# Patient Record
Sex: Female | Born: 1985 | Race: White | Hispanic: No | Marital: Married | Smoking: Never smoker
Health system: Southern US, Community
[De-identification: ages and names within clinical notes are randomized; demographics above are authoritative.]

## PROBLEM LIST (undated history)

## (undated) DIAGNOSIS — M51369 Other intervertebral disc degeneration, lumbar region without mention of lumbar back pain or lower extremity pain: Secondary | ICD-10-CM

## (undated) DIAGNOSIS — M5136 Other intervertebral disc degeneration, lumbar region: Secondary | ICD-10-CM

## (undated) DIAGNOSIS — Z8632 Personal history of gestational diabetes: Secondary | ICD-10-CM

## (undated) DIAGNOSIS — Z3A01 Less than 8 weeks gestation of pregnancy: Secondary | ICD-10-CM

## (undated) DIAGNOSIS — G4730 Sleep apnea, unspecified: Secondary | ICD-10-CM

## (undated) DIAGNOSIS — O24419 Gestational diabetes mellitus in pregnancy, unspecified control: Secondary | ICD-10-CM

## (undated) DIAGNOSIS — I10 Essential (primary) hypertension: Secondary | ICD-10-CM

## (undated) DIAGNOSIS — R011 Cardiac murmur, unspecified: Secondary | ICD-10-CM

## (undated) DIAGNOSIS — Z98891 History of uterine scar from previous surgery: Secondary | ICD-10-CM

## (undated) HISTORY — DX: Personal history of gestational diabetes: Z86.32

## (undated) HISTORY — DX: Cardiac murmur, unspecified: R01.1

## (undated) HISTORY — DX: Other intervertebral disc degeneration, lumbar region: M51.36

## (undated) HISTORY — PX: APPENDECTOMY: SHX54

## (undated) HISTORY — DX: Sleep apnea, unspecified: G47.30

## (undated) HISTORY — DX: Less than 8 weeks gestation of pregnancy: Z3A.01

## (undated) HISTORY — DX: Other intervertebral disc degeneration, lumbar region without mention of lumbar back pain or lower extremity pain: M51.369

## (undated) HISTORY — DX: History of uterine scar from previous surgery: Z98.891

---

## 2018-07-02 ENCOUNTER — Encounter (HOSPITAL_COMMUNITY): Payer: Self-pay

## 2018-07-02 LAB — OB RESULTS CONSOLE ABO/RH: RH Type: POSITIVE

## 2018-07-02 LAB — OB RESULTS CONSOLE RUBELLA ANTIBODY, IGM
Rubella: IMMUNE
Rubella: IMMUNE

## 2018-07-02 LAB — OB RESULTS CONSOLE HEPATITIS B SURFACE ANTIGEN: Hepatitis B Surface Ag: NEGATIVE

## 2018-07-02 LAB — OB RESULTS CONSOLE GC/CHLAMYDIA
Chlamydia: NEGATIVE
Gonorrhea: NEGATIVE

## 2018-07-02 LAB — OB RESULTS CONSOLE HIV ANTIBODY (ROUTINE TESTING): HIV: NONREACTIVE

## 2018-07-02 LAB — OB RESULTS CONSOLE ANTIBODY SCREEN: Antibody Screen: NEGATIVE

## 2018-07-02 LAB — OB RESULTS CONSOLE RPR: RPR: NONREACTIVE

## 2018-07-08 ENCOUNTER — Ambulatory Visit: Payer: Self-pay | Admitting: Cardiovascular Disease

## 2018-07-09 ENCOUNTER — Encounter: Payer: Self-pay | Admitting: Cardiovascular Disease

## 2018-07-21 ENCOUNTER — Encounter: Payer: Self-pay | Admitting: Cardiovascular Disease

## 2018-08-12 ENCOUNTER — Other Ambulatory Visit (HOSPITAL_COMMUNITY): Payer: Self-pay | Admitting: Obstetrics and Gynecology

## 2018-08-12 DIAGNOSIS — Z3689 Encounter for other specified antenatal screening: Secondary | ICD-10-CM

## 2018-08-12 DIAGNOSIS — Q909 Down syndrome, unspecified: Secondary | ICD-10-CM

## 2018-08-12 DIAGNOSIS — Z3A19 19 weeks gestation of pregnancy: Secondary | ICD-10-CM

## 2018-08-19 NOTE — Progress Notes (Signed)
Cardiology Office Note   Date:  08/20/2018   ID:  Amanda Lee, DOB Sep 15, 1985, MRN [id number]  PCP:  [facility]   Cardiologist:   Jenkins Rouge, MD   No chief complaint on file.     History of Present Illness: Amanda Lee is a 32 y.o. female who presents for consultation regarding murmur. Referred by Ob/Gyn Faulk ? Noted age 53. Not described currently Has not had w/u or f/u for such. No history of scarlet or rheumatic  Fever. History of gestational DM. There was also a note with ? OSA  She is remarried for 3 years Has son Ronaldo Miyamoto in middle school from first marriage and  4 months along with this pregnancy Works in Therapist, art for [employer]    Past Medical History:  Diagnosis Date  . Degeneration of lumbar intervertebral disc   . Heart murmur   . Hx of cesarean section   . Hx of gestational diabetes mellitus, not currently pregnant   . Less than [redacted] weeks gestation of pregnancy   . Sleep apnea 3       Current Outpatient Medications  Medication Sig Dispense Refill  . aspirin EC 81 MG tablet Take 81 mg by mouth daily.    . Doxylamine Succinate, Sleep, (UNISOM PO) Take by mouth daily.    Marland Kitchen labetalol (NORMODYNE) 100 MG tablet Take 100 mg by mouth 2 (two) times daily.    . Prenatal Multivit-Min-Fe-FA (PRENATAL VITAMINS PO) Take by mouth daily.    . Pyridoxine HCl (VITAMIN B6 PO) Take by mouth daily.     No current facility-administered medications for this visit.     Allergies:   Acetaminophen    Social History:  The patient  reports that she has never smoked. She has never used smokeless tobacco. She reports previous alcohol use. She reports that she does not use drugs.   Family History:  The patient's family history includes Diabetes (age of onset: 52) in her mother; Endocrine tumor (age of onset: 61) in her maternal grandmother; Heart disease in her maternal grandmother; Hypertension (age of onset: 44) in her maternal  grandmother and mother; Skin cancer in her maternal grandmother.    ROS:  Please see the history of present illness.   Otherwise, review of systems are positive for none.   All other systems are reviewed and negative.    PHYSICAL EXAM: VS:  BP 136/72   Pulse 91   Ht 5\' 3"  (1.6 m)   Wt 234 lb (106.1 kg)   BMI 41.45 kg/m  , BMI Body mass index is 41.45 kg/m. Affect appropriate Pregnant overweight white female  HEENT: normal Neck supple with no adenopathy JVP normal no bruits no thyromegaly Lungs clear with no wheezing and good diaphragmatic motion Heart:  S1/S2 no murmur, no rub, gallop or click PMI normal Abdomen: benighn, BS positve, no tenderness, no AAA post appendectomy  no bruit.  No HSM or HJR Distal pulses intact with no bruits No edema Neuro non-focal Skin warm and dry No muscular weakness    EKG:  NSR normal ECG    Recent Labs: No results found for requested labs within last 8760 hours.    Lipid Panel No results found for: CHOL, TRIG, HDL, CHOLHDL, VLDL, LDLCALC, LDLDIRECT    Wt Readings from Last 3 Encounters:  08/20/18 234 lb (106.1 kg)      Other studies Reviewed: Additional studies/ records that were reviewed today include: Notes from Ob/Gyn .  ASSESSMENT AND PLAN:  1.  Murmur:  Historical did not hear on exam today f/u TTE since she is pregnant 2.  OSA:  Best to wait until after delivery to do sleep study refer to Dr Radford Pax July 3.  DM:  Gestational f/u A1c with primary discussed low carb diet    Current medicines are reviewed at length with the patient today.  The patient does not have concerns regarding medicines.  The following changes have been made:  no change  Labs/ tests ordered today include: TTE   Orders Placed This Encounter  Procedures  . EKG 12-Lead  . ECHOCARDIOGRAM COMPLETE     Disposition:   FU with cardiology PRN      Signed, Jenkins Rouge, MD  08/20/2018 4:11 PM    [facility] [place], [place], [place]  [address] Phone: [phone]; Fax: [phone]

## 2018-08-20 ENCOUNTER — Ambulatory Visit (INDEPENDENT_AMBULATORY_CARE_PROVIDER_SITE_OTHER): Payer: 59 | Admitting: Cardiovascular Disease

## 2018-08-20 VITALS — BP 136/72 | HR 91 | Ht 63.0 in | Wt 234.0 lb

## 2018-08-20 DIAGNOSIS — R011 Cardiac murmur, unspecified: Secondary | ICD-10-CM | POA: Diagnosis not present

## 2018-08-20 NOTE — Patient Instructions (Addendum)
Medication Instructions:   If you need a refill on your cardiac medications before your next appointment, please call your pharmacy.   Lab work:  If you have labs (blood work) drawn today and your tests are completely normal, you will receive your results only by: Marland Kitchen MyChart Message (if you have MyChart) OR . A paper copy in the mail If you have any lab test that is abnormal or we need to change your treatment, we will call you to review the results.  Testing/Procedures: Your physician has requested that you have an echocardiogram. Echocardiography is a painless test that uses sound waves to create images of your heart. It provides your doctor with information about the size and shape of your heart and how well your heart's chambers and valves are working. This procedure takes approximately one hour. There are no restrictions for this procedure.  Follow-Up: At [facility], you and your health needs are our priority.  As part of our continuing mission to provide you with exceptional heart care, we have created designated Provider Care Teams.  These Care Teams include your primary Cardiologist (physician) and Advanced Practice Providers (APPs -  Physician Assistants and Nurse Practitioners) who all work together to provide you with the care you need, when you need it. Your physician recommends that you schedule a follow-up appointment as needed with Dr. Johnsie Cancel.  Your physician recommends that you schedule a follow-up appointment in: July with Dr. Radford Pax for new patient consult for sleep apnea.

## 2018-09-17 ENCOUNTER — Other Ambulatory Visit: Payer: Self-pay

## 2018-09-17 ENCOUNTER — Encounter (HOSPITAL_COMMUNITY): Payer: Self-pay

## 2018-09-17 ENCOUNTER — Ambulatory Visit (HOSPITAL_COMMUNITY): Payer: BLUE CROSS/BLUE SHIELD | Attending: Cardiovascular Disease

## 2018-09-17 DIAGNOSIS — R011 Cardiac murmur, unspecified: Secondary | ICD-10-CM | POA: Diagnosis present

## 2018-09-22 ENCOUNTER — Encounter (HOSPITAL_COMMUNITY): Payer: Self-pay | Admitting: *Deleted

## 2018-09-24 ENCOUNTER — Ambulatory Visit (HOSPITAL_COMMUNITY)
Admission: RE | Admit: 2018-09-24 | Discharge: 2018-09-24 | Disposition: A | Discharge status: Home / Self Care | Payer: BLUE CROSS/BLUE SHIELD | Source: Ambulatory Visit | Attending: Obstetrics and Gynecology | Admitting: Obstetrics and Gynecology

## 2018-09-24 ENCOUNTER — Other Ambulatory Visit: Payer: Self-pay

## 2018-09-24 ENCOUNTER — Encounter (HOSPITAL_COMMUNITY): Payer: Self-pay

## 2018-09-24 ENCOUNTER — Ambulatory Visit (HOSPITAL_COMMUNITY): Payer: Self-pay | Admitting: Obstetrics and Gynecology

## 2018-09-24 DIAGNOSIS — O289 Unspecified abnormal findings on antenatal screening of mother: Secondary | ICD-10-CM

## 2018-09-24 DIAGNOSIS — O99212 Obesity complicating pregnancy, second trimester: Secondary | ICD-10-CM | POA: Diagnosis not present

## 2018-09-24 DIAGNOSIS — Q909 Down syndrome, unspecified: Secondary | ICD-10-CM | POA: Diagnosis present

## 2018-09-24 DIAGNOSIS — O34219 Maternal care for unspecified type scar from previous cesarean delivery: Secondary | ICD-10-CM

## 2018-09-24 DIAGNOSIS — O09292 Supervision of pregnancy with other poor reproductive or obstetric history, second trimester: Secondary | ICD-10-CM | POA: Diagnosis not present

## 2018-09-24 DIAGNOSIS — O132 Gestational [pregnancy-induced] hypertension without significant proteinuria, second trimester: Secondary | ICD-10-CM | POA: Diagnosis not present

## 2018-09-24 DIAGNOSIS — Z3689 Encounter for other specified antenatal screening: Secondary | ICD-10-CM | POA: Insufficient documentation

## 2018-09-24 DIAGNOSIS — Z3A19 19 weeks gestation of pregnancy: Secondary | ICD-10-CM | POA: Diagnosis present

## 2018-09-24 HISTORY — DX: Other intervertebral disc degeneration, lumbar region without mention of lumbar back pain or lower extremity pain: M51.369

## 2018-09-24 HISTORY — DX: Other intervertebral disc degeneration, lumbar region: M51.36

## 2018-09-24 HISTORY — DX: Essential (primary) hypertension: I10

## 2018-10-15 ENCOUNTER — Other Ambulatory Visit (HOSPITAL_COMMUNITY): Payer: Self-pay | Admitting: Obstetrics and Gynecology

## 2018-10-19 ENCOUNTER — Other Ambulatory Visit (HOSPITAL_COMMUNITY): Payer: Self-pay | Admitting: Obstetrics and Gynecology

## 2018-10-19 DIAGNOSIS — O09292 Supervision of pregnancy with other poor reproductive or obstetric history, second trimester: Secondary | ICD-10-CM

## 2018-11-03 ENCOUNTER — Encounter: Payer: Self-pay | Admitting: Registered"

## 2018-11-03 ENCOUNTER — Encounter: Payer: BLUE CROSS/BLUE SHIELD | Attending: Obstetrics and Gynecology | Admitting: Registered"

## 2018-11-03 DIAGNOSIS — O99810 Abnormal glucose complicating pregnancy: Secondary | ICD-10-CM | POA: Diagnosis present

## 2018-11-03 NOTE — Progress Notes (Signed)
Patient was seen on 11/03/18 for Gestational Diabetes self-management class at the [facility]. The following learning objectives were met by the patient during this course:   States the definition of Gestational Diabetes  States why dietary management is important in controlling blood glucose  Describes the effects each nutrient has on blood glucose levels  Demonstrates ability to create a balanced meal plan  Demonstrates carbohydrate counting   States when to check blood glucose levels  Demonstrates proper blood glucose monitoring techniques  States the effect of stress and exercise on blood glucose levels  States the importance of limiting caffeine and abstaining from alcohol and smoking  Blood glucose monitor given: Accu-chek guide me Lot # [id number] Exp: 10/26/19 Blood glucose reading: 104 mg/dL  Patient instructed to monitor glucose levels: FBS: 60 - <95; 1 hour: <140; 2 hour: <120  Patient received handouts:  Nutrition Diabetes and Pregnancy, including carb counting list  Patient will be seen for follow-up as needed.

## 2018-11-12 ENCOUNTER — Ambulatory Visit (HOSPITAL_COMMUNITY)
Admission: RE | Admit: 2018-11-12 | Discharge: 2018-11-12 | Disposition: A | Discharge status: Home / Self Care | Payer: BLUE CROSS/BLUE SHIELD | Source: Ambulatory Visit | Attending: Obstetrics and Gynecology | Admitting: Obstetrics and Gynecology

## 2018-11-12 ENCOUNTER — Other Ambulatory Visit: Payer: Self-pay

## 2018-11-12 DIAGNOSIS — Z3A26 26 weeks gestation of pregnancy: Secondary | ICD-10-CM

## 2018-11-12 DIAGNOSIS — Z362 Encounter for other antenatal screening follow-up: Secondary | ICD-10-CM | POA: Diagnosis not present

## 2018-11-12 DIAGNOSIS — O34219 Maternal care for unspecified type scar from previous cesarean delivery: Secondary | ICD-10-CM | POA: Diagnosis not present

## 2018-11-12 DIAGNOSIS — O09292 Supervision of pregnancy with other poor reproductive or obstetric history, second trimester: Secondary | ICD-10-CM

## 2018-11-12 DIAGNOSIS — O99213 Obesity complicating pregnancy, third trimester: Secondary | ICD-10-CM

## 2018-11-12 DIAGNOSIS — O132 Gestational [pregnancy-induced] hypertension without significant proteinuria, second trimester: Secondary | ICD-10-CM

## 2018-11-12 DIAGNOSIS — O3412 Maternal care for benign tumor of corpus uteri, second trimester: Secondary | ICD-10-CM

## 2018-11-12 DIAGNOSIS — O289 Unspecified abnormal findings on antenatal screening of mother: Secondary | ICD-10-CM

## 2018-11-15 ENCOUNTER — Other Ambulatory Visit (HOSPITAL_COMMUNITY): Payer: Self-pay | Admitting: *Deleted

## 2018-11-15 DIAGNOSIS — Z362 Encounter for other antenatal screening follow-up: Secondary | ICD-10-CM

## 2018-12-10 ENCOUNTER — Ambulatory Visit (HOSPITAL_COMMUNITY): Payer: BLUE CROSS/BLUE SHIELD

## 2019-01-07 ENCOUNTER — Ambulatory Visit (HOSPITAL_COMMUNITY)
Admission: RE | Admit: 2019-01-07 | Discharge: 2019-01-07 | Disposition: A | Discharge status: Home / Self Care | Payer: BLUE CROSS/BLUE SHIELD | Source: Ambulatory Visit | Attending: Maternal & Fetal Medicine | Admitting: Maternal & Fetal Medicine

## 2019-01-07 ENCOUNTER — Other Ambulatory Visit (HOSPITAL_COMMUNITY): Payer: Self-pay | Admitting: Maternal & Fetal Medicine

## 2019-01-07 ENCOUNTER — Other Ambulatory Visit: Payer: Self-pay

## 2019-01-07 DIAGNOSIS — O24419 Gestational diabetes mellitus in pregnancy, unspecified control: Secondary | ICD-10-CM | POA: Diagnosis not present

## 2019-01-07 DIAGNOSIS — O289 Unspecified abnormal findings on antenatal screening of mother: Secondary | ICD-10-CM

## 2019-01-07 DIAGNOSIS — O09299 Supervision of pregnancy with other poor reproductive or obstetric history, unspecified trimester: Secondary | ICD-10-CM

## 2019-01-07 DIAGNOSIS — O3410 Maternal care for benign tumor of corpus uteri, unspecified trimester: Secondary | ICD-10-CM

## 2019-01-07 DIAGNOSIS — O99213 Obesity complicating pregnancy, third trimester: Secondary | ICD-10-CM | POA: Diagnosis not present

## 2019-01-07 DIAGNOSIS — O34219 Maternal care for unspecified type scar from previous cesarean delivery: Secondary | ICD-10-CM

## 2019-01-07 DIAGNOSIS — Z3A34 34 weeks gestation of pregnancy: Secondary | ICD-10-CM

## 2019-01-07 DIAGNOSIS — Z362 Encounter for other antenatal screening follow-up: Secondary | ICD-10-CM | POA: Insufficient documentation

## 2019-01-07 DIAGNOSIS — O139 Gestational [pregnancy-induced] hypertension without significant proteinuria, unspecified trimester: Secondary | ICD-10-CM

## 2019-01-11 ENCOUNTER — Other Ambulatory Visit (HOSPITAL_COMMUNITY): Payer: Self-pay | Admitting: *Deleted

## 2019-01-11 DIAGNOSIS — O24415 Gestational diabetes mellitus in pregnancy, controlled by oral hypoglycemic drugs: Secondary | ICD-10-CM

## 2019-01-18 ENCOUNTER — Encounter (HOSPITAL_COMMUNITY): Payer: Self-pay | Admitting: *Deleted

## 2019-01-21 ENCOUNTER — Encounter (HOSPITAL_COMMUNITY): Payer: Self-pay | Admitting: *Deleted

## 2019-01-21 NOTE — Patient Instructions (Signed)
Amanda Lee  01/21/2019   Your procedure is scheduled on:  02/02/2019  Arrive at 59 at Entrance C on [address] at [facility]  and [address] [employer] [address]. You are invited to use the FREE valet parking or use the Visitor's parking deck.  Pick up the phone at the desk and dial [phone].  Call this number if you have problems the morning of surgery: [phone]  Remember:   Do not eat food:(After Midnight) Desps de medianoche.  Do not drink clear liquids: (After Midnight) Desps de medianoche.  Take these medicines the morning of surgery with A SIP OF WATER:  Take labetalol as prescribed.  DO NOT TAKE METFORMIN THE NIGHT BEFORE OR THE MORNING OF SURGERY   Do not wear jewelry, make-up or nail polish.  Do not wear lotions, powders, or perfumes. Do not wear deodorant.  Do not shave 48 hours prior to surgery.  Do not bring valuables to the hospital.  [facility] is not   responsible for any belongings or valuables brought to the hospital.  Contacts, dentures or bridgework may not be worn into surgery.  Leave suitcase in the car. After surgery it may be brought to your room.  For patients admitted to the hospital, checkout time is 11:00 AM the day of              discharge.      Please read over the following fact sheets that you were given:     Preparing for Surgery

## 2019-01-28 ENCOUNTER — Other Ambulatory Visit: Payer: Self-pay

## 2019-01-28 ENCOUNTER — Encounter (HOSPITAL_COMMUNITY): Payer: Self-pay

## 2019-01-28 ENCOUNTER — Inpatient Hospital Stay (HOSPITAL_COMMUNITY)
Admission: AD | Admit: 2019-01-28 | Discharge: 2019-01-28 | Disposition: A | Discharge status: Home / Self Care | Payer: BLUE CROSS/BLUE SHIELD | Attending: Obstetrics and Gynecology | Admitting: Obstetrics and Gynecology

## 2019-01-28 DIAGNOSIS — O471 False labor at or after 37 completed weeks of gestation: Secondary | ICD-10-CM | POA: Insufficient documentation

## 2019-01-28 DIAGNOSIS — O479 False labor, unspecified: Secondary | ICD-10-CM

## 2019-01-28 DIAGNOSIS — Z0371 Encounter for suspected problem with amniotic cavity and membrane ruled out: Secondary | ICD-10-CM | POA: Diagnosis not present

## 2019-01-28 DIAGNOSIS — Z3A38 38 weeks gestation of pregnancy: Secondary | ICD-10-CM | POA: Insufficient documentation

## 2019-01-28 LAB — POCT FERN TEST: POCT Fern Test: NEGATIVE

## 2019-01-28 LAB — AMNISURE RUPTURE OF MEMBRANE (ROM) NOT AT ARMC: Amnisure ROM: NEGATIVE

## 2019-01-28 NOTE — MAU Note (Signed)
Ctxs off and on all day. Closer since 1845. Has been leaking fld/dc for past wk. For repeat c/s

## 2019-01-28 NOTE — Discharge Instructions (Signed)
Call your OB Clinic or go to [facility] if:  You begin to have strong, frequent contractions  Your water breaks.  Sometimes it is a big gush of fluid, sometimes it is just a trickle that keeps getting your panties wet or running down your legs  You have vaginal bleeding.  It is normal to have a small amount of spotting if your cervix was checked.   You don't feel your baby moving like normal.  If you don't, get you something to eat and drink and lay down and focus on feeling your baby move.  You should feel at least 10 movements in 2 hours.  If you don't, you should call the office or go to [facility].

## 2019-01-31 ENCOUNTER — Other Ambulatory Visit: Payer: Self-pay

## 2019-01-31 ENCOUNTER — Other Ambulatory Visit (HOSPITAL_COMMUNITY)
Admission: RE | Admit: 2019-01-31 | Discharge: 2019-01-31 | Disposition: A | Discharge status: Home / Self Care | Payer: BLUE CROSS/BLUE SHIELD | Source: Ambulatory Visit | Attending: Obstetrics and Gynecology | Admitting: Obstetrics and Gynecology

## 2019-01-31 DIAGNOSIS — Z1159 Encounter for screening for other viral diseases: Secondary | ICD-10-CM | POA: Insufficient documentation

## 2019-01-31 NOTE — MAU Note (Signed)
Asymptomatic. Swab collected without problem.

## 2019-02-01 LAB — NOVEL CORONAVIRUS, NAA (HOSP ORDER, SEND-OUT TO REF LAB; TAT 18-24 HRS): SARS-CoV-2, NAA: NOT DETECTED
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
## 2019-02-02 ENCOUNTER — Inpatient Hospital Stay (HOSPITAL_COMMUNITY): Payer: BLUE CROSS/BLUE SHIELD | Admitting: Anesthesiology

## 2019-02-02 ENCOUNTER — Encounter (HOSPITAL_COMMUNITY)
Admission: RE | Disposition: A | Discharge status: Home / Self Care | Payer: Self-pay | Source: Home / Self Care | Attending: Obstetrics and Gynecology

## 2019-02-02 ENCOUNTER — Encounter (HOSPITAL_COMMUNITY): Payer: Self-pay

## 2019-02-02 ENCOUNTER — Inpatient Hospital Stay (HOSPITAL_COMMUNITY)
Admission: RE | Admit: 2019-02-02 | Discharge: 2019-02-04 | DRG: 787 | Disposition: A | Discharge status: Home / Self Care | Payer: BLUE CROSS/BLUE SHIELD | Attending: Obstetrics and Gynecology | Admitting: Obstetrics and Gynecology

## 2019-02-02 ENCOUNTER — Other Ambulatory Visit: Payer: Self-pay

## 2019-02-02 DIAGNOSIS — Z1159 Encounter for screening for other viral diseases: Secondary | ICD-10-CM | POA: Diagnosis not present

## 2019-02-02 DIAGNOSIS — O1002 Pre-existing essential hypertension complicating childbirth: Secondary | ICD-10-CM | POA: Diagnosis present

## 2019-02-02 DIAGNOSIS — O34211 Maternal care for low transverse scar from previous cesarean delivery: Principal | ICD-10-CM | POA: Diagnosis present

## 2019-02-02 DIAGNOSIS — O3413 Maternal care for benign tumor of corpus uteri, third trimester: Secondary | ICD-10-CM | POA: Diagnosis present

## 2019-02-02 DIAGNOSIS — O99824 Streptococcus B carrier state complicating childbirth: Secondary | ICD-10-CM | POA: Diagnosis present

## 2019-02-02 DIAGNOSIS — Z3A38 38 weeks gestation of pregnancy: Secondary | ICD-10-CM | POA: Diagnosis not present

## 2019-02-02 DIAGNOSIS — D252 Subserosal leiomyoma of uterus: Secondary | ICD-10-CM | POA: Diagnosis present

## 2019-02-02 DIAGNOSIS — Z3490 Encounter for supervision of normal pregnancy, unspecified, unspecified trimester: Secondary | ICD-10-CM

## 2019-02-02 DIAGNOSIS — O24429 Gestational diabetes mellitus in childbirth, unspecified control: Secondary | ICD-10-CM | POA: Diagnosis present

## 2019-02-02 DIAGNOSIS — Z98891 History of uterine scar from previous surgery: Secondary | ICD-10-CM

## 2019-02-02 DIAGNOSIS — O3663X0 Maternal care for excessive fetal growth, third trimester, not applicable or unspecified: Secondary | ICD-10-CM | POA: Diagnosis present

## 2019-02-02 DIAGNOSIS — O321XX0 Maternal care for breech presentation, not applicable or unspecified: Secondary | ICD-10-CM | POA: Diagnosis present

## 2019-02-02 DIAGNOSIS — O99214 Obesity complicating childbirth: Secondary | ICD-10-CM | POA: Diagnosis present

## 2019-02-02 HISTORY — DX: Gestational diabetes mellitus in pregnancy, unspecified control: O24.419

## 2019-02-02 LAB — TYPE AND SCREEN
ABO/RH(D): O POS
Antibody Screen: NEGATIVE

## 2019-02-02 LAB — CBC
HCT: 35 % — ABNORMAL LOW (ref 36.0–46.0)
Hemoglobin: 11.1 g/dL — ABNORMAL LOW (ref 12.0–15.0)
MCH: 26.3 pg (ref 26.0–34.0)
MCHC: 31.7 g/dL (ref 30.0–36.0)
MCV: 82.9 fL (ref 80.0–100.0)
Platelets: 157 10*3/uL (ref 150–400)
RBC: 4.22 MIL/uL (ref 3.87–5.11)
RDW: 15.1 % (ref 11.5–15.5)
WBC: 7.2 10*3/uL (ref 4.0–10.5)
nRBC: 0 % (ref 0.0–0.2)

## 2019-02-02 LAB — COMPREHENSIVE METABOLIC PANEL
ALT: 20 U/L (ref 0–44)
AST: 19 U/L (ref 15–41)
Albumin: 2.5 g/dL — ABNORMAL LOW (ref 3.5–5.0)
Alkaline Phosphatase: 121 U/L (ref 38–126)
Anion gap: 11 (ref 5–15)
BUN: 8 mg/dL (ref 6–20)
CO2: 22 mmol/L (ref 22–32)
Calcium: 9.1 mg/dL (ref 8.9–10.3)
Chloride: 105 mmol/L (ref 98–111)
Creatinine, Ser: 0.53 mg/dL (ref 0.44–1.00)
GFR calc Af Amer: >60 mL/min (ref >60)
GFR calc non Af Amer: >60 mL/min (ref >60)
Glucose, Bld: 128 mg/dL — ABNORMAL HIGH (ref 70–99)
Potassium: 4 mmol/L (ref 3.5–5.1)
Sodium: 138 mmol/L (ref 135–145)
Total Bilirubin: 0.3 mg/dL (ref 0.3–1.2)
Total Protein: 5.3 g/dL — ABNORMAL LOW (ref 6.5–8.1)

## 2019-02-02 LAB — GLUCOSE, CAPILLARY: Glucose-Capillary: 128 mg/dL — ABNORMAL HIGH (ref 70–99)

## 2019-02-02 SURGERY — Surgical Case
Anesthesia: Spinal

## 2019-02-02 MED ORDER — OXYCODONE HCL 5 MG/5ML PO SOLN: 5.0000 mg | Freq: Once | ORAL | Status: DC | PRN

## 2019-02-02 MED ORDER — SOD CITRATE-CITRIC ACID 500-334 MG/5ML PO SOLN
30.0000 mL | ORAL | Status: AC
  Administered 2019-02-02: 30 mL via ORAL

## 2019-02-02 MED ORDER — METOCLOPRAMIDE HCL 5 MG/ML IJ SOLN
INTRAMUSCULAR | Status: DC | PRN
  Administered 2019-02-02: 10 mg via INTRAVENOUS

## 2019-02-02 MED ORDER — NALOXONE HCL 0.4 MG/ML IJ SOLN: 0.4000 mg | INTRAMUSCULAR | Status: DC | PRN

## 2019-02-02 MED ORDER — ACETAMINOPHEN 160 MG/5ML PO SOLN: 325.0000 mg | ORAL | Status: DC | PRN

## 2019-02-02 MED ORDER — MEPERIDINE HCL 25 MG/ML IJ SOLN: 6.2500 mg | INTRAMUSCULAR | Status: DC | PRN

## 2019-02-02 MED ORDER — ZOLPIDEM TARTRATE 5 MG PO TABS: 5.0000 mg | ORAL_TABLET | Freq: Every evening | ORAL | Status: DC | PRN

## 2019-02-02 MED ORDER — SODIUM CHLORIDE 0.9% FLUSH: 3.0000 mL | INTRAVENOUS | Status: DC | PRN

## 2019-02-02 MED ORDER — LABETALOL HCL 200 MG PO TABS: 200.0000 mg | ORAL_TABLET | Freq: Every day | ORAL | Status: DC

## 2019-02-02 MED ORDER — MORPHINE SULFATE (PF) 0.5 MG/ML IJ SOLN
INTRAMUSCULAR | Status: AC
  Filled 2019-02-02: qty 10

## 2019-02-02 MED ORDER — MORPHINE SULFATE (PF) 0.5 MG/ML IJ SOLN
INTRAMUSCULAR | Status: DC | PRN
  Administered 2019-02-02: .15 mg via INTRATHECAL

## 2019-02-02 MED ORDER — DEXTROSE 5 % IV SOLN
INTRAVENOUS | Status: AC
  Filled 2019-02-02: qty 3000

## 2019-02-02 MED ORDER — NALBUPHINE HCL 10 MG/ML IJ SOLN: 5.0000 mg | INTRAMUSCULAR | Status: DC | PRN

## 2019-02-02 MED ORDER — FENTANYL CITRATE (PF) 100 MCG/2ML IJ SOLN
INTRAMUSCULAR | Status: AC
  Filled 2019-02-02: qty 2

## 2019-02-02 MED ORDER — DEXAMETHASONE SODIUM PHOSPHATE 4 MG/ML IJ SOLN
INTRAMUSCULAR | Status: AC
  Filled 2019-02-02: qty 1

## 2019-02-02 MED ORDER — COCONUT OIL OIL
1.0000 "application " | TOPICAL_OIL | Status: DC | PRN
  Administered 2019-02-03: 1 via TOPICAL

## 2019-02-02 MED ORDER — KETOROLAC TROMETHAMINE 30 MG/ML IJ SOLN
INTRAMUSCULAR | Status: AC
  Filled 2019-02-02: qty 1

## 2019-02-02 MED ORDER — ACETAMINOPHEN 325 MG PO TABS: 325.0000 mg | ORAL_TABLET | ORAL | Status: DC | PRN

## 2019-02-02 MED ORDER — DIPHENHYDRAMINE HCL 50 MG/ML IJ SOLN: 12.5000 mg | INTRAMUSCULAR | Status: DC | PRN

## 2019-02-02 MED ORDER — OXYTOCIN 40 UNITS IN NORMAL SALINE INFUSION - SIMPLE MED
INTRAVENOUS | Status: DC | PRN
  Administered 2019-02-02 (×5): 100 mL via INTRAVENOUS

## 2019-02-02 MED ORDER — SCOPOLAMINE 1 MG/3DAYS TD PT72
MEDICATED_PATCH | TRANSDERMAL | Status: AC
  Filled 2019-02-02: qty 1

## 2019-02-02 MED ORDER — SIMETHICONE 80 MG PO CHEW
80.0000 mg | CHEWABLE_TABLET | Freq: Three times a day (TID) | ORAL | Status: DC
  Administered 2019-02-03 – 2019-02-04 (×3): 80 mg via ORAL
  Filled 2019-02-02 (×4): qty 1

## 2019-02-02 MED ORDER — DIPHENHYDRAMINE HCL 25 MG PO CAPS: 25.0000 mg | ORAL_CAPSULE | ORAL | Status: DC | PRN

## 2019-02-02 MED ORDER — SCOPOLAMINE 1 MG/3DAYS TD PT72
1.0000 | MEDICATED_PATCH | Freq: Once | TRANSDERMAL | Status: DC
  Administered 2019-02-02: 1.5 mg via TRANSDERMAL

## 2019-02-02 MED ORDER — DEXTROSE 5 % IV SOLN
3.0000 g | INTRAVENOUS | Status: AC
  Administered 2019-02-02: 3 g via INTRAVENOUS
  Filled 2019-02-02: qty 3000

## 2019-02-02 MED ORDER — SENNOSIDES-DOCUSATE SODIUM 8.6-50 MG PO TABS
2.0000 | ORAL_TABLET | ORAL | Status: DC
  Administered 2019-02-02 – 2019-02-03 (×2): 2 via ORAL
  Filled 2019-02-02 (×2): qty 2

## 2019-02-02 MED ORDER — FENTANYL CITRATE (PF) 100 MCG/2ML IJ SOLN
INTRAMUSCULAR | Status: DC | PRN
  Administered 2019-02-02: 15 ug via INTRATHECAL

## 2019-02-02 MED ORDER — OXYCODONE HCL 5 MG PO TABS
5.0000 mg | ORAL_TABLET | ORAL | Status: DC | PRN
  Administered 2019-02-03: 5 mg via ORAL
  Filled 2019-02-02: qty 1

## 2019-02-02 MED ORDER — KETOROLAC TROMETHAMINE 30 MG/ML IJ SOLN
30.0000 mg | Freq: Four times a day (QID) | INTRAMUSCULAR | Status: AC | PRN
  Administered 2019-02-02: 13:00:00 30 mg via INTRAVENOUS

## 2019-02-02 MED ORDER — OXYTOCIN 40 UNITS IN NORMAL SALINE INFUSION - SIMPLE MED
INTRAVENOUS | Status: AC
  Filled 2019-02-02: qty 1000

## 2019-02-02 MED ORDER — PHENYLEPHRINE 40 MCG/ML (10ML) SYRINGE FOR IV PUSH (FOR BLOOD PRESSURE SUPPORT)
PREFILLED_SYRINGE | INTRAVENOUS | Status: AC
  Filled 2019-02-02: qty 30

## 2019-02-02 MED ORDER — METOCLOPRAMIDE HCL 5 MG/ML IJ SOLN
INTRAMUSCULAR | Status: AC
  Filled 2019-02-02: qty 2

## 2019-02-02 MED ORDER — PHENYLEPHRINE HCL-NACL 20-0.9 MG/250ML-% IV SOLN
INTRAVENOUS | Status: AC
  Filled 2019-02-02: qty 250

## 2019-02-02 MED ORDER — FENTANYL CITRATE (PF) 100 MCG/2ML IJ SOLN: 25.0000 ug | INTRAMUSCULAR | Status: DC | PRN

## 2019-02-02 MED ORDER — DIBUCAINE (PERIANAL) 1 % EX OINT: 1.0000 "application " | TOPICAL_OINTMENT | CUTANEOUS | Status: DC | PRN

## 2019-02-02 MED ORDER — LACTATED RINGERS IV SOLN
INTRAVENOUS | Status: DC
  Administered 2019-02-02 (×2): via INTRAVENOUS

## 2019-02-02 MED ORDER — KETOROLAC TROMETHAMINE 30 MG/ML IJ SOLN: 30.0000 mg | Freq: Four times a day (QID) | INTRAMUSCULAR | Status: AC | PRN

## 2019-02-02 MED ORDER — OXYCODONE HCL 5 MG PO TABS: 5.0000 mg | ORAL_TABLET | Freq: Once | ORAL | Status: DC | PRN

## 2019-02-02 MED ORDER — PHENYLEPHRINE HCL (PRESSORS) 10 MG/ML IV SOLN
INTRAVENOUS | Status: DC | PRN
  Administered 2019-02-02 (×11): 100 ug via INTRAVENOUS

## 2019-02-02 MED ORDER — BUPIVACAINE IN DEXTROSE 0.75-8.25 % IT SOLN
INTRATHECAL | Status: DC | PRN
  Administered 2019-02-02: 1.5 mL via INTRATHECAL

## 2019-02-02 MED ORDER — ONDANSETRON HCL 4 MG/2ML IJ SOLN
INTRAMUSCULAR | Status: AC
  Filled 2019-02-02: qty 2

## 2019-02-02 MED ORDER — MENTHOL 3 MG MT LOZG: 1.0000 | LOZENGE | OROMUCOSAL | Status: DC | PRN

## 2019-02-02 MED ORDER — KETOROLAC TROMETHAMINE 30 MG/ML IJ SOLN: 30.0000 mg | Freq: Once | INTRAMUSCULAR | Status: DC

## 2019-02-02 MED ORDER — NALOXONE HCL 4 MG/10ML IJ SOLN
1.0000 ug/kg/h | INTRAVENOUS | Status: DC | PRN
  Filled 2019-02-02: qty 5

## 2019-02-02 MED ORDER — OXYTOCIN 40 UNITS IN NORMAL SALINE INFUSION - SIMPLE MED: 2.5000 [IU]/h | INTRAVENOUS | Status: AC

## 2019-02-02 MED ORDER — PHENYLEPHRINE HCL-NACL 20-0.9 MG/250ML-% IV SOLN
INTRAVENOUS | Status: DC | PRN
  Administered 2019-02-02: 60 ug/min via INTRAVENOUS

## 2019-02-02 MED ORDER — DIPHENHYDRAMINE HCL 25 MG PO CAPS: 25.0000 mg | ORAL_CAPSULE | Freq: Four times a day (QID) | ORAL | Status: DC | PRN

## 2019-02-02 MED ORDER — IBUPROFEN 800 MG PO TABS
800.0000 mg | ORAL_TABLET | Freq: Three times a day (TID) | ORAL | Status: DC
  Administered 2019-02-02 – 2019-02-04 (×5): 800 mg via ORAL
  Filled 2019-02-02 (×5): qty 1

## 2019-02-02 MED ORDER — ONDANSETRON HCL 4 MG/2ML IJ SOLN: 4.0000 mg | Freq: Three times a day (TID) | INTRAMUSCULAR | Status: DC | PRN

## 2019-02-02 MED ORDER — TETANUS-DIPHTH-ACELL PERTUSSIS 5-2.5-18.5 LF-MCG/0.5 IM SUSP: 0.5000 mL | Freq: Once | INTRAMUSCULAR | Status: DC

## 2019-02-02 MED ORDER — LACTATED RINGERS IV SOLN
INTRAVENOUS | Status: DC
  Administered 2019-02-02: 18:00:00 via INTRAVENOUS

## 2019-02-02 MED ORDER — SIMETHICONE 80 MG PO CHEW: 80.0000 mg | CHEWABLE_TABLET | ORAL | Status: DC | PRN

## 2019-02-02 MED ORDER — ONDANSETRON HCL 4 MG/2ML IJ SOLN: 4.0000 mg | Freq: Once | INTRAMUSCULAR | Status: DC | PRN

## 2019-02-02 MED ORDER — SOD CITRATE-CITRIC ACID 500-334 MG/5ML PO SOLN
ORAL | Status: AC
  Filled 2019-02-02: qty 15

## 2019-02-02 MED ORDER — DEXAMETHASONE SODIUM PHOSPHATE 4 MG/ML IJ SOLN
INTRAMUSCULAR | Status: DC | PRN
  Administered 2019-02-02: 4 mg via INTRAVENOUS

## 2019-02-02 MED ORDER — NALBUPHINE HCL 10 MG/ML IJ SOLN: 5.0000 mg | Freq: Once | INTRAMUSCULAR | Status: DC | PRN

## 2019-02-02 MED ORDER — PRENATAL MULTIVITAMIN CH
1.0000 | ORAL_TABLET | Freq: Every day | ORAL | Status: DC
  Filled 2019-02-02: qty 1

## 2019-02-02 MED ORDER — SIMETHICONE 80 MG PO CHEW
80.0000 mg | CHEWABLE_TABLET | ORAL | Status: DC
  Administered 2019-02-02 – 2019-02-03 (×2): 80 mg via ORAL
  Filled 2019-02-02 (×2): qty 1

## 2019-02-02 MED ORDER — WITCH HAZEL-GLYCERIN EX PADS: 1.0000 "application " | MEDICATED_PAD | CUTANEOUS | Status: DC | PRN

## 2019-02-02 MED ORDER — ONDANSETRON HCL 4 MG/2ML IJ SOLN
INTRAMUSCULAR | Status: DC | PRN
  Administered 2019-02-02: 4 mg via INTRAVENOUS

## 2019-02-02 SURGICAL SUPPLY — 39 items
BENZOIN TINCTURE PRP APPL 2/3 (GAUZE/BANDAGES/DRESSINGS) ×3 IMPLANT
CHLORAPREP W/TINT 26ML (MISCELLANEOUS) ×3 IMPLANT
CLAMP CORD UMBIL (MISCELLANEOUS) IMPLANT
CLOSURE STERI-STRIP 1/2X4 (GAUZE/BANDAGES/DRESSINGS) ×1
CLOSURE WOUND 1/2 X4 (GAUZE/BANDAGES/DRESSINGS)
CLOTH BEACON ORANGE TIMEOUT ST (SAFETY) ×3 IMPLANT
CLSR STERI-STRIP ANTIMIC 1/2X4 (GAUZE/BANDAGES/DRESSINGS) ×2 IMPLANT
DRAPE C SECTION CLR SCREEN (DRAPES) ×3 IMPLANT
DRSG OPSITE POSTOP 4X10 (GAUZE/BANDAGES/DRESSINGS) ×3 IMPLANT
ELECT REM PT RETURN 9FT ADLT (ELECTROSURGICAL) ×3
ELECTRODE REM PT RTRN 9FT ADLT (ELECTROSURGICAL) ×1 IMPLANT
EXTRACTOR VACUUM KIWI (MISCELLANEOUS) IMPLANT
GLOVE BIO SURGEON STRL SZ 6.5 (GLOVE) ×2 IMPLANT
GLOVE BIO SURGEONS STRL SZ 6.5 (GLOVE) ×1
GLOVE BIOGEL PI IND STRL 7.0 (GLOVE) ×2 IMPLANT
GLOVE BIOGEL PI INDICATOR 7.0 (GLOVE) ×4
GOWN STRL REUS W/TWL LRG LVL3 (GOWN DISPOSABLE) ×6 IMPLANT
KIT ABG SYR 3ML LUER SLIP (SYRINGE) IMPLANT
NEEDLE HYPO 25X5/8 SAFETYGLIDE (NEEDLE) IMPLANT
NS IRRIG 1000ML POUR BTL (IV SOLUTION) ×3 IMPLANT
PACK C SECTION WH (CUSTOM PROCEDURE TRAY) ×3 IMPLANT
PAD OB MATERNITY 4.3X12.25 (PERSONAL CARE ITEMS) ×3 IMPLANT
RETRACTOR WND ALEXIS 25 LRG (MISCELLANEOUS) ×1 IMPLANT
RTRCTR C-SECT PINK 25CM LRG (MISCELLANEOUS) IMPLANT
RTRCTR WOUND ALEXIS 25CM LRG (MISCELLANEOUS) ×3
STRIP CLOSURE SKIN 1/2X4 (GAUZE/BANDAGES/DRESSINGS) IMPLANT
SUT CHROMIC 1 CTX 36 (SUTURE) ×6 IMPLANT
SUT PLAIN 0 NONE (SUTURE) IMPLANT
SUT PLAIN 2 0 XLH (SUTURE) ×3 IMPLANT
SUT VIC AB 0 CT1 27 (SUTURE) ×4
SUT VIC AB 0 CT1 27XBRD ANBCTR (SUTURE) ×2 IMPLANT
SUT VIC AB 2-0 CT1 27 (SUTURE) ×4
SUT VIC AB 2-0 CT1 TAPERPNT 27 (SUTURE) ×2 IMPLANT
SUT VIC AB 3-0 CT1 27 (SUTURE)
SUT VIC AB 3-0 CT1 TAPERPNT 27 (SUTURE) IMPLANT
SUT VIC AB 4-0 KS 27 (SUTURE) ×3 IMPLANT
TOWEL OR 17X24 6PK STRL BLUE (TOWEL DISPOSABLE) ×3 IMPLANT
TRAY FOLEY W/BAG SLVR 14FR LF (SET/KITS/TRAYS/PACK) ×3 IMPLANT
WATER STERILE IRR 1000ML POUR (IV SOLUTION) ×3 IMPLANT

## 2019-02-02 NOTE — Anesthesia Postprocedure Evaluation (Signed)
Anesthesia Post Note  Patient: Janyah Singleterry  Procedure(s) Performed: rREPEAT CESAREAN SECTION (N/A )     Patient location during evaluation: PACU Anesthesia Type: Spinal Level of consciousness: oriented and awake and alert Pain management: pain level controlled Vital Signs Assessment: post-procedure vital signs reviewed and stable Respiratory status: spontaneous breathing, respiratory function stable and patient connected to nasal cannula oxygen Cardiovascular status: blood pressure returned to baseline and stable Postop Assessment: no headache, no backache and no apparent nausea or vomiting Anesthetic complications: no    Last Vitals:  Vitals:   02/02/19 1615 02/02/19 1700  BP: (!) 120/59   Pulse: 72   Resp: 18 20  Temp: 37.3 C 37.2 C  SpO2: 95% 94%    Last Pain:  Vitals:   02/02/19 1905  TempSrc:   PainSc: 0-No pain                 Jacynda Brunke

## 2019-02-02 NOTE — Anesthesia Preprocedure Evaluation (Addendum)
Anesthesia Evaluation  Patient identified by MRN, date of birth, ID band Patient awake    Reviewed: Allergy & Precautions, H&P , NPO status , Patient's Chart, lab work & pertinent test results, reviewed documented beta blocker date and time   Airway Mallampati: III  TM Distance: >3 FB Neck ROM: full    Dental no notable dental hx. (+) Teeth Intact, Poor Dentition   Pulmonary sleep apnea ,    Pulmonary exam normal breath sounds clear to auscultation       Cardiovascular hypertension, Pt. on medications and Pt. on home beta blockers negative cardio ROS Normal cardiovascular exam Rhythm:regular Rate:Normal  ECHO 1/20 Study Conclusions  - Left ventricle: The cavity size was normal. There was mild   concentric hypertrophy. Systolic function was normal. The   estimated ejection fraction was in the range of 60% to 65%. Wall   motion was normal; there were no regional wall motion   abnormalities. Left ventricular diastolic function parameters   were normal. - Aortic valve: Transvalvular velocity was within the normal range.   There was no stenosis. There was no regurgitation. - Mitral valve: Transvalvular velocity was within the normal range.   There was no evidence for stenosis. There was no regurgitation. - Right ventricle: The cavity size was normal. Wall thickness was   normal. Systolic function was normal. - Tricuspid valve: Transvalvular velocity was within the normal   range. There was no regurgitation.    Neuro/Psych negative neurological ROS  negative psych ROS   GI/Hepatic negative GI ROS, Neg liver ROS,   Endo/Other  diabetes, Gestational, Oral Hypoglycemic AgentsMorbid obesity  Renal/GU negative Renal ROS  negative genitourinary   Musculoskeletal   Abdominal (+) + obese,   Peds  Hematology negative hematology ROS (+)   Anesthesia Other Findings   Reproductive/Obstetrics (+) Pregnancy                             Anesthesia Physical Anesthesia Plan  ASA: III  Anesthesia Plan: Spinal   Post-op Pain Management:    Induction:   PONV Risk Score and Plan: 2 and Treatment may vary due to age or medical condition and Scopolamine patch - Pre-op  Airway Management Planned: Nasal Cannula, Simple Face Mask and Mask  Additional Equipment:   Intra-op Plan:   Post-operative Plan:   Informed Consent: I have reviewed the patients History and Physical, chart, labs and discussed the procedure including the risks, benefits and alternatives for the proposed anesthesia with the patient or authorized representative who has indicated his/her understanding and acceptance.       Plan Discussed with:   Anesthesia Plan Comments: (  )        Anesthesia Quick Evaluation

## 2019-02-02 NOTE — Lactation Note (Signed)
This note was copied from a baby's chart. Lactation Consultation Note  Patient Name: Amanda Lee IPJAS'N Date: 02/02/2019 Reason for consult: Initial assessment;Early term 23-38.6wks  P2 mother whose infant is now 65 hours old.  Mother breast fed her first child (now 33 years old) for 6 months.  Mother had gestational diabetes.  Baby had an intial blood sugar of 21 mg/dl which is now been within normal range  Baby was not showing feeding cues when I arrived, however, mother wanted to try to breast feed.  Taught hand expression and mother was able to express a drop of colostrum from the right breast.  Finger fed this drop to baby.    Mother's breasts are soft and non tender and nipples are everted and intact.  Baby has a small mouth and recessed chin.  Educated mother on the importance of a wide open mouth prior to latching to obtain a deep latch.  Assisted to latch in the football hold on the right breast.  With gentle stimulation he began sucking and continued on/off for 10 minutes, requiring stimulation.  Mother denied pain with feeding.  When he became too sleepy to continue I placed him STS on mother's chest.  Mother's nipple well rounded after releasing.  Encouraged to feed 8-12 times/24 hours or sooner if baby shows feeding cues.  Reviewed cues.  Colostrum container provided for any EBM she obtains with hand expression.  Milk storage times reviewed and finger feeding demonstrated.  Mother will return to work after discharge and has her own personal pump at bedside.  Discussed pumping and freezing breast milk and storage times in Mother/Baby booklet.  Encouraged mother to call for latch assistance as needed.  Father present and supportive.   Maternal Data Formula Feeding for Exclusion: No Has patient been taught Hand Expression?: Yes Does the patient have breastfeeding experience prior to this delivery?: Yes  Feeding Feeding Type: Breast Fed  LATCH Score Latch: Grasps breast easily,  tongue down, lips flanged, rhythmical sucking.  Audible Swallowing: A few with stimulation  Type of Nipple: Everted at rest and after stimulation  Comfort (Breast/Nipple): Soft / non-tender  Hold (Positioning): Assistance needed to correctly position infant at breast and maintain latch.  LATCH Score: 8  Interventions Interventions: Breast feeding basics reviewed;Assisted with latch;Skin to skin;Breast massage;Hand express;Breast compression;Position options;Support pillows;Adjust position  Lactation Tools Discussed/Used     Consult Status Consult Status: Follow-up Date: 02/03/19 Follow-up type: In-patient    [name] 02/02/2019, 8:40 PM

## 2019-02-02 NOTE — Op Note (Signed)
Operative Note    Preoperative Diagnosis 1. IUP at 38 4/7wks 2. Transverse lie 3. LGA   Postoperative Diagnosis: Same   Procedure: Repeat low transverse cesarean section with double layered closure   Surgeon: Mickle Mallory DO Assists: Maida Sale RNFA  Anesthesia: Spinal  Fluids:2388ml EBL: 652ml UOP48ml  Findings: Viable female infant in transverse lie ( head maternal R); grossly normal uterus ( with small subserosal fibroid 3cm); nl tubes and ovaries; Apgars 9,9; weight pending   Specimen: Placenta to L/D   Procedure Note Consent verified pre-op. All questions answered   Patient was taken to the operating room where spinal anesthesia was administered. She was prepped and draped in the normal sterile fashion with traxi and placed in the dorsal supine position with a leftward tilt. An appropriate time out was performed. A Pfannenstiel skin incision was then made through the previous incision with the scalpel and carried through to the underlying layer of fascia by sharp dissection and Bovie cautery. The fascia was nicked in the midline and the incision was extended laterally with Mayo scissors. The superior and inferior aspects of the incision were grasped Coker clamps and dissected off the underlying rectus muscles.Rectus muscles were separated in the midline  and the peritoneal cavity entered bluntly. The peritoneal incision was then extended both superiorly and inferiorly with careful attention to avoid both bowel and bladder. The Alexis self-retaining wound retractor was then placed and the lower uterine segment exposed. The bladder flap was developed with Metzenbaum scissors and pushed away from the lower uterine segment. The lower uterine segment was then incised in a transverse fashion and the cavity itself entered bluntly. Anterior placenta encountered. Fetus was confirmed in transverse lie; rotated to frank breech and delivered easily with one fundal push. Vigorous spontaneous  cry was noted during delivery. Nose and mouth were bulb suctioned. The cord was clamped and cut after a minute delay. The infant was handed off to the waiting Nicu staff. The placenta was then spontaneously expressed from the uterus and the uterus cleared of all clots and debris with moist lap sponge. The uterine incision was then repaired in 2 layers the first layer was a running locked layer 1-0 chromic and the second an imbricating layer of the same suture.  Small bleeding in both corners stopped with figure 8 stitches of 2-0 vicryl.  The tubes and ovaries were inspected and the gutters cleared of all clots and debris. The uterine incision was inspected and found to be hemostatic. All instruments and sponges were then removed from the abdomen. The peritoneum was then reapproximated in a running fashion with  2-0 Vicryl.  The fascia was then closed with 0 Vicryl in a running fashion. Subcutaneous tissue was reapproximated with 3-0 plain in a running fashion from both corners. The subcuticular layer was closed with 3-0 vicryl and skin was closed with a subcuticular stitch of 4-0 Vicryl on a Keith needle and then reinforced with benzoin and Steri-Strips. At the conclusion of the procedure all instruments and sponge counts were correct. Patient was taken to the recovery room in good condition with her baby accompanying her skin to skin.

## 2019-02-02 NOTE — Transfer of Care (Signed)
Immediate Anesthesia Transfer of Care Note  Patient: Amanda Lee  Procedure(s) Performed: rREPEAT CESAREAN SECTION (N/A )  Patient Location: PACU  Anesthesia Type:Spinal  Level of Consciousness: awake, alert  and oriented  Airway & Oxygen Therapy: Patient Spontanous Breathing  Post-op Assessment: Report given to RN and Post -op Vital signs reviewed and stable  Post vital signs: Reviewed and stable  Last Vitals:  Vitals Value Taken Time  BP 102/92 02/02/2019 11:38 AM  Temp    Pulse 96 02/02/2019 11:47 AM  Resp 23 02/02/2019 11:47 AM  SpO2 98 % 02/02/2019 11:47 AM  Vitals shown include unvalidated device data.  Last Pain:  Vitals:   02/02/19 0815  TempSrc:   PainSc: 0-No pain         Complications: No apparent anesthesia complications

## 2019-02-02 NOTE — Anesthesia Procedure Notes (Signed)
Spinal  Patient location during procedure: OR Start time: 02/02/2019 10:20 AM End time: 02/02/2019 10:25 AM Staffing Anesthesiologist: Janeece Riggers, MD Preanesthetic Checklist Completed: patient identified, site marked, surgical consent, pre-op evaluation, timeout performed, IV checked, risks and benefits discussed and monitors and equipment checked Spinal Block Patient position: sitting Prep: DuraPrep Patient monitoring: heart rate, cardiac monitor, continuous pulse ox and blood pressure Approach: midline Location: L3-4 Injection technique: single-shot Needle Needle type: Sprotte  Needle gauge: 24 G Needle length: 9 cm Assessment Sensory level: T4

## 2019-02-02 NOTE — Interval H&P Note (Signed)
History and Physical Interval Note: No change. Consent verified and procedure reviewed  To OR when ready 02/02/2019 10:29 AM  Amanda Lee  has presented today for surgery, with the diagnosis of repeat C -Section.  The various methods of treatment have been discussed with the patient and family. After consideration of risks, benefits and other options for treatment, the patient has consented to  Procedure(s) with comments: rREPEAT CESAREAN SECTION (N/A) - Tracey RNFA as a surgical intervention.  The patient's history has been reviewed, patient examined, no change in status, stable for surgery.  I have reviewed the patient's chart and labs.  Questions were answered to the patient's satisfaction.     [name]

## 2019-02-03 LAB — GLUCOSE, CAPILLARY
Glucose-Capillary: 100 mg/dL — ABNORMAL HIGH (ref 70–99)
Glucose-Capillary: 109 mg/dL — ABNORMAL HIGH (ref 70–99)

## 2019-02-03 LAB — ABO/RH: ABO/RH(D): O POS

## 2019-02-03 LAB — CBC
HCT: 25.7 % — ABNORMAL LOW (ref 36.0–46.0)
Hemoglobin: 8.6 g/dL — ABNORMAL LOW (ref 12.0–15.0)
MCH: 27.4 pg (ref 26.0–34.0)
MCHC: 33.5 g/dL (ref 30.0–36.0)
MCV: 81.8 fL (ref 80.0–100.0)
Platelets: 128 10*3/uL — ABNORMAL LOW (ref 150–400)
RBC: 3.14 MIL/uL — ABNORMAL LOW (ref 3.87–5.11)
RDW: 15.2 % (ref 11.5–15.5)
WBC: 9.5 10*3/uL (ref 4.0–10.5)
nRBC: 0 % (ref 0.0–0.2)

## 2019-02-03 LAB — BIRTH TISSUE RECOVERY COLLECTION (PLACENTA DONATION)

## 2019-02-03 LAB — RPR: RPR Ser Ql: NONREACTIVE

## 2019-02-03 MED ORDER — LABETALOL HCL 100 MG PO TABS: 100.0000 mg | ORAL_TABLET | Freq: Every day | ORAL | Status: DC

## 2019-02-03 NOTE — Lactation Note (Signed)
This note was copied from a baby's chart. Lactation Consultation Note  Patient Name: Amanda Lee NIOEV'O Date: 02/03/2019 Reason for consult: Follow-up assessment;Early term 52-38.6wks Baby is 24 hours old/2% weight loss.  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  Baby is also receiving some formula supplementation.  Mom is pumping with her personal DEBP for breast stimulation.  Baby is currently sleeping.  Instructed to put to breast with cues and call for assist prn.  Maternal Data    Feeding    LATCH Score                   Interventions    Lactation Tools Discussed/Used     Consult Status Consult Status: Follow-up Date: 02/04/19 Follow-up type: In-patient    [name] 02/03/2019, 10:52 AM

## 2019-02-03 NOTE — Progress Notes (Signed)
Inpatient Diabetes Program Recommendations  AACE/ADA: New Consensus Statement on Inpatient Glycemic Control (2015)  Target Ranges:  Prepandial:   less than 140 mg/dL      Peak postprandial:   less than 180 mg/dL (1-2 hours)      Critically ill patients:  140 - 180 mg/dL   Results for CATELIN, MANTHE (MRN [id number]) as of 02/03/2019 09:00  Ref. Range 02/02/2019 08:27 02/03/2019 00:41 02/03/2019 03:43  Glucose-Capillary Latest Ref Range: 70 - 99 mg/dL 128 (H) 109 (H) 100 (H)   Review of Glycemic Control  Diabetes history: GDM Outpatient Diabetes medications: Metformin 500 mg QAM, Metformin 1000 mg QPM Current orders for Inpatient glycemic control: CBGs Q4H  Inpatient Diabetes Program Recommendations:   Diet: Please consider changing diet to Carb Modified if appropriate.  NOTE: Noted consult for Diabetes Coordinator. Chart reviewed. Patient has hx of GDM and was taking Metformin as an outpatient prior to admission. Patient received Decadron 4mg  at 10:48 am on 02/02/19 which will impact glycemic control.  Glucose has ranged from 100-109 mg/dl since delivery. Would not order any DM medications at this time. Will follow glycemic trends while inpatient.  Thanks, Barnie Alderman, RN, MSN, CDE Diabetes Coordinator Inpatient Diabetes Program [phone] (Team Pager from 8am to 5pm)

## 2019-02-03 NOTE — Progress Notes (Signed)
Subjective: Postpartum Day 1: Cesarean Delivery Patient reports incisional pain and tolerating PO.    Objective: Vital signs in last 24 hours: Temp:  [96.8 F (36 C)-99.1 F (37.3 C)] 98.3 F (36.8 C) (05/28 0400) Pulse Rate:  [60-108] 86 (05/28 0400) Resp:  [10-31] 18 (05/28 0400) BP: (89-133)/(40-114) 102/48 (05/28 0400) SpO2:  [89 %-99 %] 96 % (05/28 0400) Weight:  [110.2 kg] 110.2 kg (05/27 0815)  Physical Exam:  General: alert and no distress Lochia: appropriate Uterine Fundus: firm Incision: healing well DVT Evaluation: No evidence of DVT seen on physical exam.  Recent Labs    02/02/19 0802 02/03/19 0528  HGB 11.1* 8.6*  HCT 35.0* 25.7*    Assessment/Plan: Status post Cesarean section. Doing well postoperatively.  Continue current care.  Routine postoperative/postpartum care.    Amanda Lee 02/03/2019, 7:35 AM

## 2019-02-04 ENCOUNTER — Encounter (HOSPITAL_COMMUNITY): Payer: Self-pay

## 2019-02-04 ENCOUNTER — Ambulatory Visit (HOSPITAL_COMMUNITY): Payer: BLUE CROSS/BLUE SHIELD

## 2019-02-04 LAB — GLUCOSE, CAPILLARY: Glucose-Capillary: 89 mg/dL (ref 70–99)

## 2019-02-04 MED ORDER — IBUPROFEN 800 MG PO TABS: 800.0000 mg | ORAL_TABLET | Freq: Three times a day (TID) | ORAL | 0 refills | Status: AC

## 2019-02-04 NOTE — Discharge Summary (Signed)
OB Discharge Summary     Patient Name: Amanda Lee DOB: [birth year] MRN: [id number]  Date of admission: 02/02/2019 Delivering MD: Carlynn Purl [facility]   Date of discharge: 02/04/2019  Admitting diagnosis: repeat C -Section Intrauterine pregnancy: [redacted]w[redacted]d     Secondary diagnosis:  Active Problems:   Status post repeat low transverse cesarean section   Term pregnancy   Postpartum care following cesarean delivery  Additional problems: none     Discharge diagnosis: Term Pregnancy Delivered                                                                                                Post partum procedures:none  Complications: None  Hospital course:  Sceduled C/S   33 y.o. yo G2P2002 at [redacted]w[redacted]d was admitted to the hospital 02/02/2019 for scheduled cesarean section with the following indication:uncontrolled diabetes and prior c-section.  Membrane Rupture Time/Date: 10:43 AM ,02/02/2019   Patient delivered a Viable infant.02/02/2019  Details of operation can be found in separate operative note.  Pateint had an uncomplicated postpartum course.  She is ambulating, tolerating a regular diet, passing flatus, and urinating well. Patient is discharged home in stable condition on  02/04/19         Physical exam  Vitals:   02/03/19 0957 02/03/19 1415 02/03/19 2113 02/04/19 0551  BP: 115/64 (!) 116/50 (!) 154/52 138/62  Pulse: (!) 102 89 100 90  Resp: 18 18 20 16   Temp: 98.6 F (37 C) 99.6 F (37.6 C) 98.3 F (36.8 C) 98.2 F (36.8 C)  TempSrc: Oral Oral Axillary Oral  SpO2: 96%   99%  Weight:      Height:       General: alert and cooperative Lochia: appropriate Uterine Fundus: firm Incision: Dressing is clean, dry, and intact  Labs: Lab Results  Component Value Date   WBC 9.5 02/03/2019   HGB 8.6 (L) 02/03/2019   HCT 25.7 (L) 02/03/2019   MCV 81.8 02/03/2019   PLT 128 (L) 02/03/2019   CMP Latest Ref Rng & Units 02/02/2019  Glucose 70 - 99 mg/dL 128(H)  BUN 6 - 20 mg/dL 8   Creatinine 0.44 - 1.00 mg/dL 0.53  Sodium 135 - 145 mmol/L 138  Potassium 3.5 - 5.1 mmol/L 4.0  Chloride 98 - 111 mmol/L 105  CO2 22 - 32 mmol/L 22  Calcium 8.9 - 10.3 mg/dL 9.1  Total Protein 6.5 - 8.1 g/dL 5.3(L)  Total Bilirubin 0.3 - 1.2 mg/dL 0.3  Alkaline Phos 38 - 126 U/L 121  AST 15 - 41 U/L 19  ALT 0 - 44 U/L 20    Discharge instruction: per After Visit Summary and "Baby and Me Booklet".  After visit meds:  Allergies as of 02/04/2019      Reactions   Acetaminophen Hives   HIVES      Medication List    STOP taking these medications   labetalol 200 MG tablet Commonly known as:  NORMODYNE   metFORMIN 500 MG tablet Commonly known as:  GLUCOPHAGE     TAKE these medications   ibuprofen 800 MG tablet  Commonly known as:  ADVIL Take 1 tablet (800 mg total) by mouth every 8 (eight) hours.   magnesium oxide 400 MG tablet Commonly known as:  MAG-OX Take 400 mg by mouth daily.   PRENATAL DHA PO Take 1 tablet by mouth daily.       Diet: routine diet  Activity: Advance as tolerated. Pelvic rest for 6 weeks.   Outpatient follow up: 1 week for incision check and removal of dressing Follow up Appt:No future appointments. Follow up Visit:No follow-ups on file.  Postpartum contraception: Undecided  Newborn Data: Live born female  Birth Weight: 8 lb 12 oz (3970 g) APGAR: ,   Newborn Delivery   Birth date/time:  02/02/2019 10:44:00 Delivery type:  C-Section, Low Transverse Trial of labor:  No C-section categorization:  Repeat     Baby Feeding: Breast Disposition:home with mother   Plans circumcision office   02/04/2019 Logan Bores, MD

## 2019-02-04 NOTE — Progress Notes (Signed)
Subjective: Postpartum Day 2 Cesarean Delivery Patient reports tolerating PO and no problems voiding.    Objective: Vital signs in last 24 hours: Temp:  [98.2 F (36.8 C)-99.6 F (37.6 C)] 98.2 F (36.8 C) (05/29 0551) Pulse Rate:  [89-102] 90 (05/29 0551) Resp:  [16-20] 16 (05/29 0551) BP: (115-154)/(50-64) 138/62 (05/29 0551) SpO2:  [96 %-99 %] 99 % (05/29 0551) All BS WNL  Physical Exam:  General: alert and cooperative Lochia: appropriate Uterine Fundus: firm Incision: C/D/I   Recent Labs    02/02/19 0802 02/03/19 0528  HGB 11.1* 8.6*  HCT 35.0* 25.7*    Assessment/Plan: Status post Cesarean section. Doing well postoperatively.  All BS WNL since delivery as well as BP D/c home on no meds Return to office one week to remove dressing  Logan Bores 02/04/2019, 8:56 AM

## 2019-02-07 LAB — GLUCOSE, CAPILLARY: Glucose-Capillary: 126 mg/dL — ABNORMAL HIGH (ref 70–99)

## 2019-02-14 ENCOUNTER — Encounter (HOSPITAL_COMMUNITY): Payer: Self-pay | Admitting: Obstetrics and Gynecology

## 2019-03-04 ENCOUNTER — Encounter (HOSPITAL_COMMUNITY): Payer: Self-pay | Admitting: Obstetrics and Gynecology

## 2019-03-04 NOTE — Addendum Note (Signed)
Addendum  created 03/04/19 0953 by Bufford Spikes, CRNA   Intraprocedure Event edited

## 2019-05-12 IMAGING — US US MFM OB DETAIL+14 WK
1 series · 13 of 28 positions shown · non-contrast
Comparison: none

[Series 1: us mfm ob detail+14 wk · 95 acquisitions, 13 frames shown]
[im 4/95]
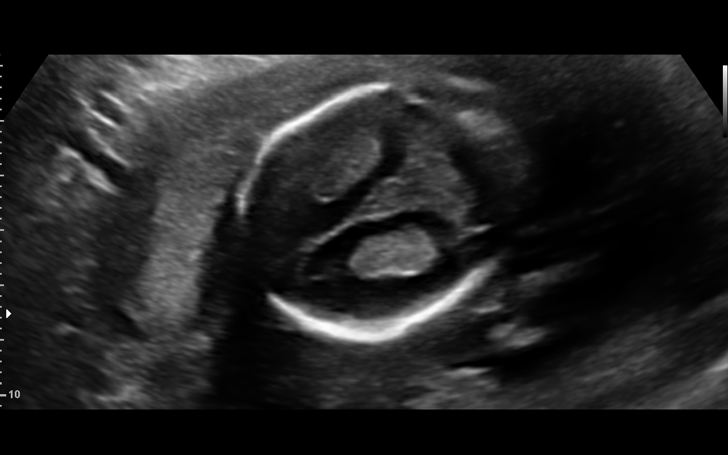
[im 11/95]
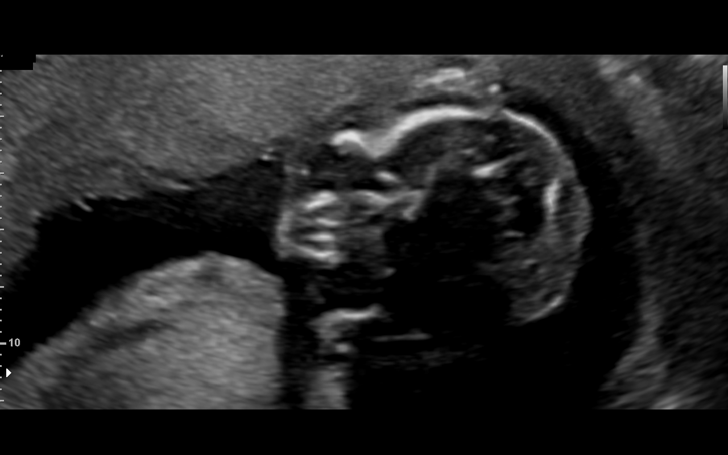
[im 18/95]
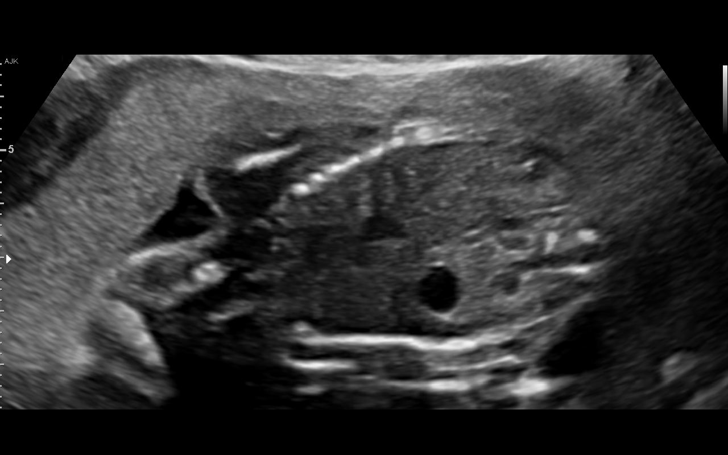
[im 25/95]
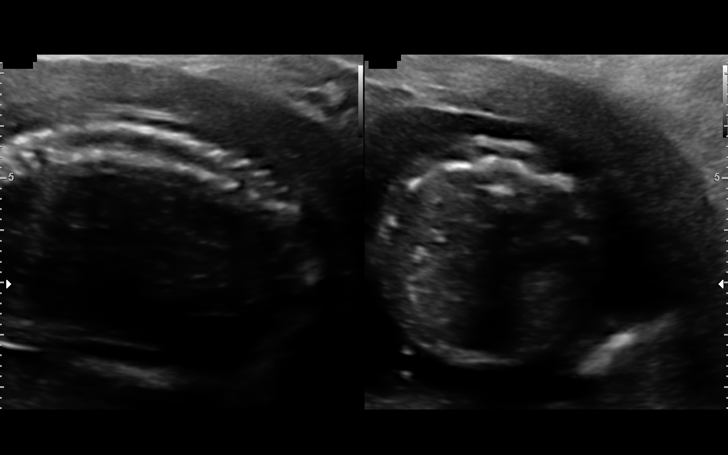
[im 32/95]
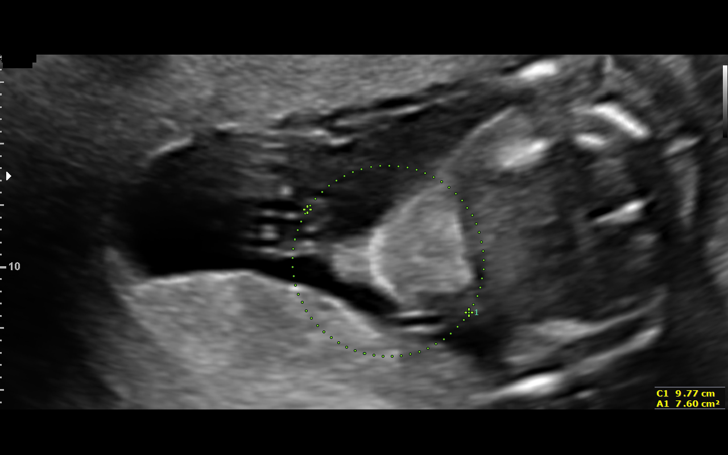
[im 39/95]
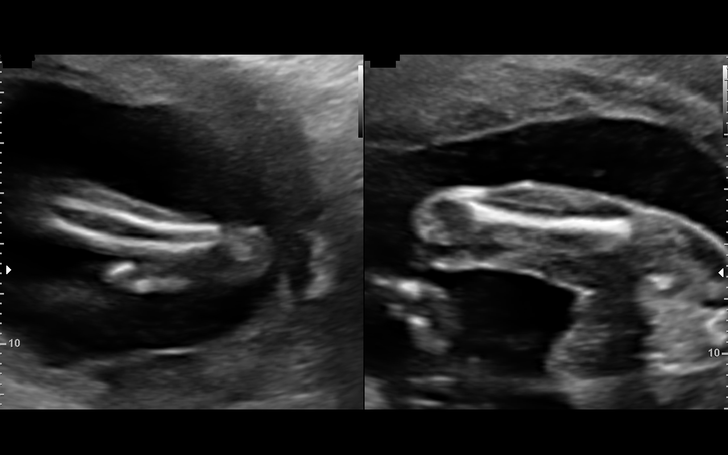
[im 49/95]
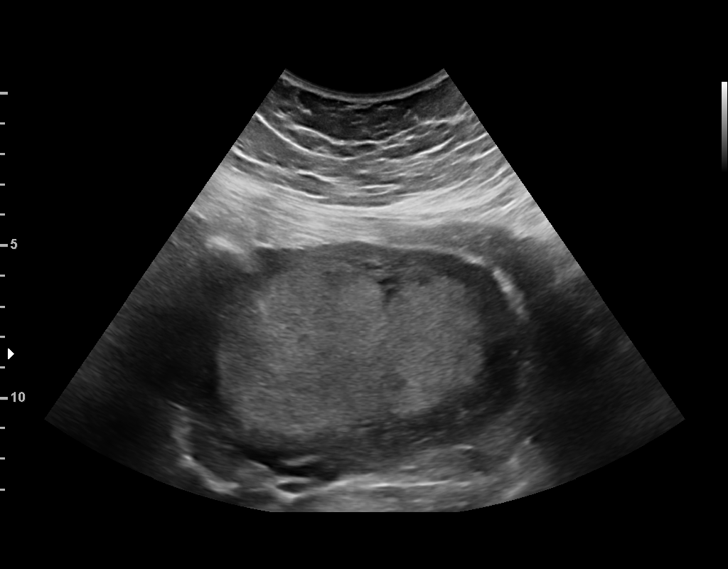
[im 56/95]
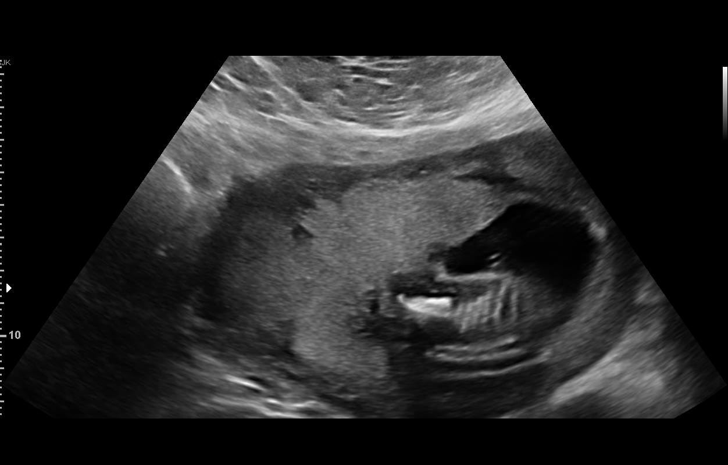
[im 63/95]
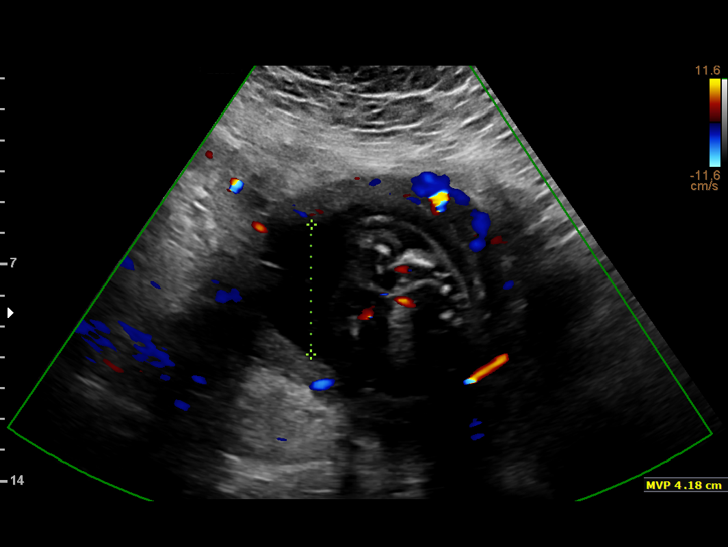
[im 70/95]
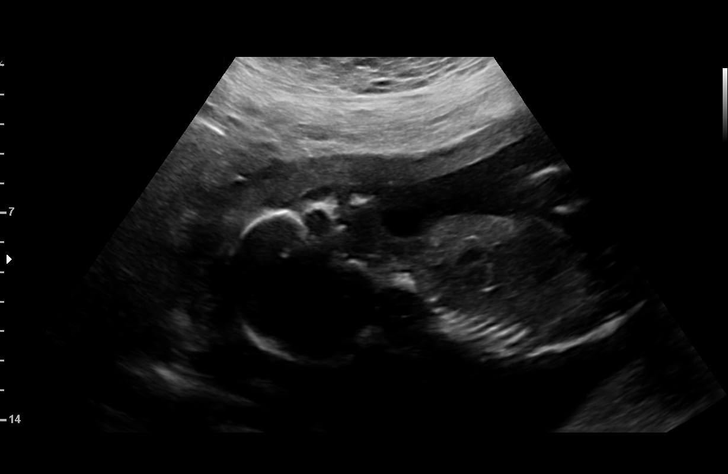
[im 77/95]
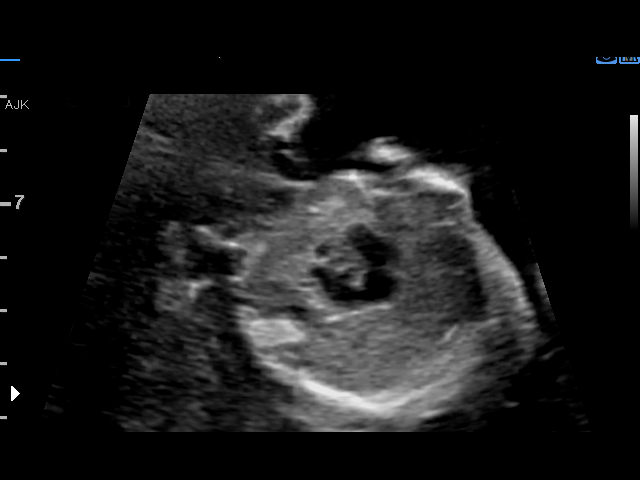
[im 84/95]
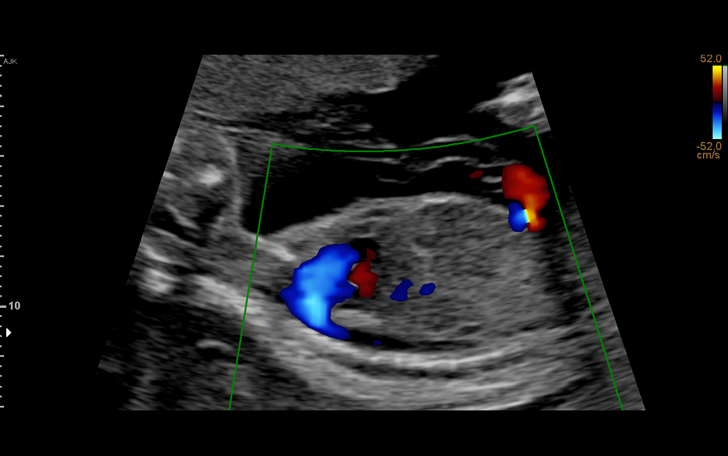
[im 91/95]
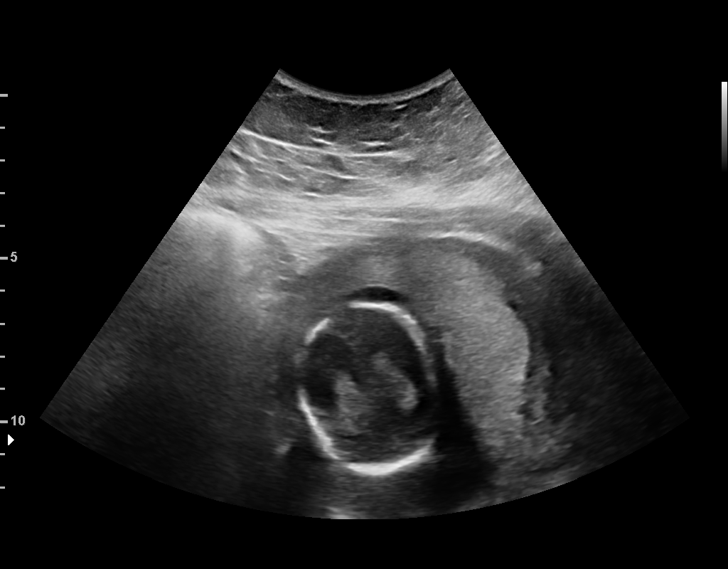

[13 of 28 positions shown; findings below may reference images not displayed]

#101

 ----------------------------------------------------------------------

 ----------------------------------------------------------------------
Indications

  19 weeks gestation of pregnancy
  Abnormal first trimester screen (NT) (T21
  Obesity complicating pregnancy, second
  trimester
  Encounter for antenatal screening for
  malformations
  Poor obstetric history: Previous gestational
  diabetes
  Previous cesarean delivery, antepartum
  Hypertension - Gestational? (labetalol,
  magnesium, asa)
 ----------------------------------------------------------------------
Vital Signs

 (lb):
 BMI:
Fetal Evaluation

 Num Of Fetuses:          1
 Fetal Heart              137
 Rate(bpm):
 Cardiac Activity:        Observed
 Presentation:            Transverse, head to maternal right
 Placenta:                Anterior Fundal

 Amniotic Fluid
 AFI FV:      Within normal limits

                             Largest Pocket(cm)

Biometry

 BPD:      44.1  mm     G. Age:  19w 2d         28  %    CI:         70.82  %    70 - 86
                                                         FL/HC:       18.2  %    16.8 -
 HC:        167  mm     G. Age:  19w 3d         22  %    HC/AC:       1.11       1.09 -
 AC:      150.1  mm     G. Age:  20w 1d         58  %    FL/BPD:      68.9  %
 FL:       30.4  mm     G. Age:  19w 3d         27  %    FL/AC:       20.3  %    20 - 24
 HUM:      29.9  mm     G. Age:  19w 6d         52  %
 CER:      18.9  mm     G. Age:  18w 3d          9  %
 NFT:       4.6  mm
 LV:        6.4  mm
 CM:        5.4  mm

 Est. FW:     313   g    0 lb 11 oz      48  %
                    m
OB History

 Gravidity:    2         Term:   1        Prem:   0         SAB:   0
 TOP:          0       Ectopic:  0        Living: 1
Gestational Age

 LMP:           21w 5d        Date:  04/25/18                 EDD:    01/30/19
 U/S Today:     19w 4d                                        EDD:    02/14/19
 Best:          19w 6d     Det. By:  Previous Ultrasound      EDD:    02/12/19
                                     (07/02/18)
Anatomy

 Cranium:               Appears normal         Aortic Arch:            Appears normal
 Cavum:                 Appears normal         Ductal Arch:            Appears normal
 Ventricles:            Appears normal         Diaphragm:              Appears normal
 Choroid Plexus:        Appears normal         Stomach:                Appears normal,
                                                                       left sided
 Cerebellum:            Appears normal         Abdomen:                Appears normal
 Posterior Fossa:       Appears normal         Abdominal Wall:         Appears nml (cord
                                                                       insert, abd wall)
 Nuchal Fold:           Appears normal         Cord Vessels:           Appears normal (3
                                                                       vessel cord)
 Face:                  Appears normal         Kidneys:                Appear normal
                        (orbits and profile)
 Lips:                  Appears normal         Bladder:                Appears normal
 Thoracic:              Appears normal         Spine:                  Appears normal
 Heart:                 Appears normal         Upper Extremities:      Appears normal
                        (4CH, axis, and
                        situs)
 RVOT:                  Appears normal         Lower Extremities:      Appears normal
 LVOT:                  Appears normal

 Other:  Fetus appears to be a male. Heels visualized. Nasal bone visualized.
         Technically difficult due to maternal habitus and fetal position.
Cervix Uterus Adnexa

 Cervix
 persistent ctx in LUS
 Uterus
 Single fibroid noted, see table below.

 Left Ovary
 Within normal limits.

 Right Ovary
 Within normal limits.

 Adnexa
 No abnormality visualized.
Myomas

  Site                     L(cm)      W(cm)       D(cm)      Location
  Right
 ----------------------------------------------------------------------

  Blood Flow                 RI        PI       Comments

 ----------------------------------------------------------------------
Comments

 I met with Ms. Gladyselis and discussed her abnormal LEXU result
 and overall management of Chronic hypertension. She i

---------------------------------------------------------------------- Impression

 Normal interval growth.  No ultrasonic evidence of structural
 fetal anomalies.
 Chronic hypertension on medication
Recommendations

 Continue serial growth every 4 weeks
 Initiate weekly testing at 32 weeks.

## 2019-08-25 IMAGING — US US MFM OB FOLLOW UP
1 series · 13 of 28 positions shown · non-contrast
Comparison: none

[Series 1: us mfm ob follow up · 31 acquisitions, 13 frames shown]
[im 2/31]
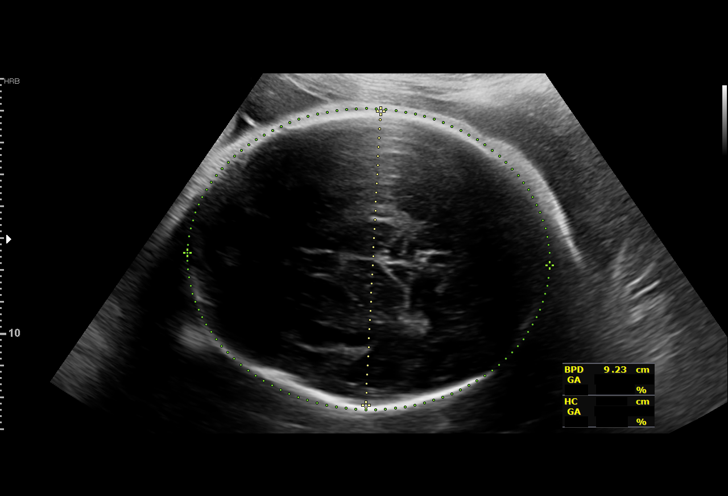
[im 4/31]
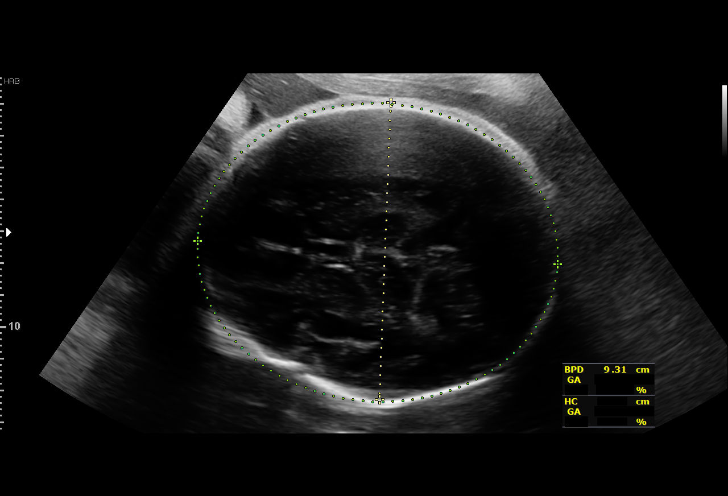
[im 6/31]
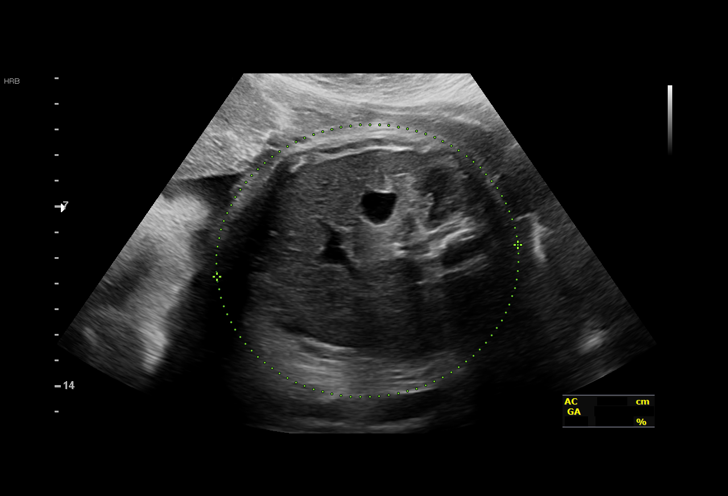
[im 8/31]
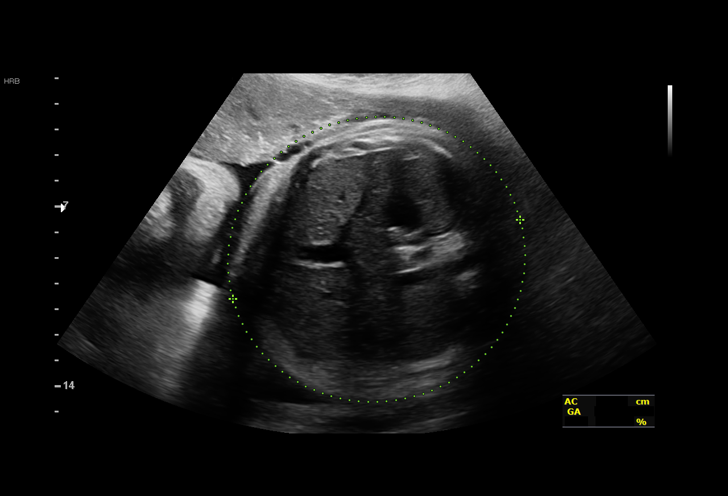
[im 11/31]
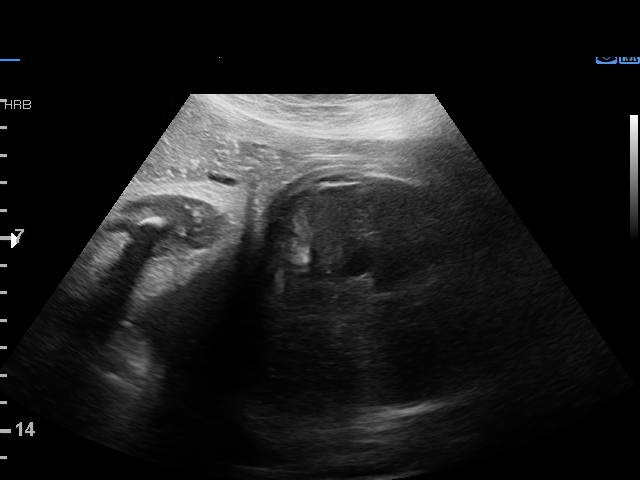
[im 13/31]
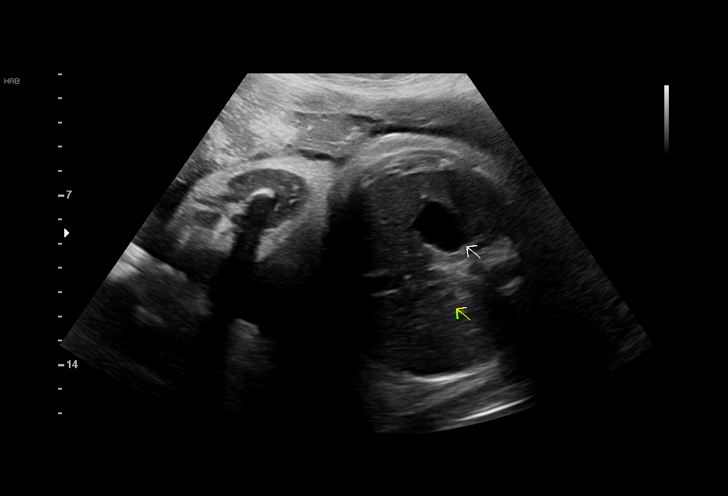
[im 16/31]
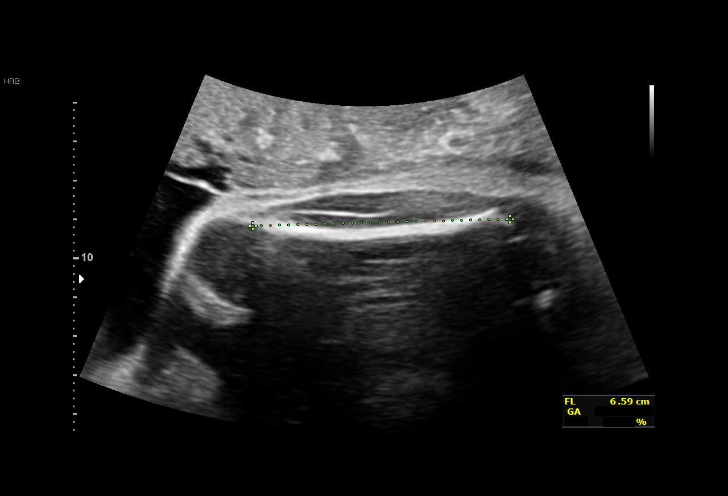
[im 18/31]
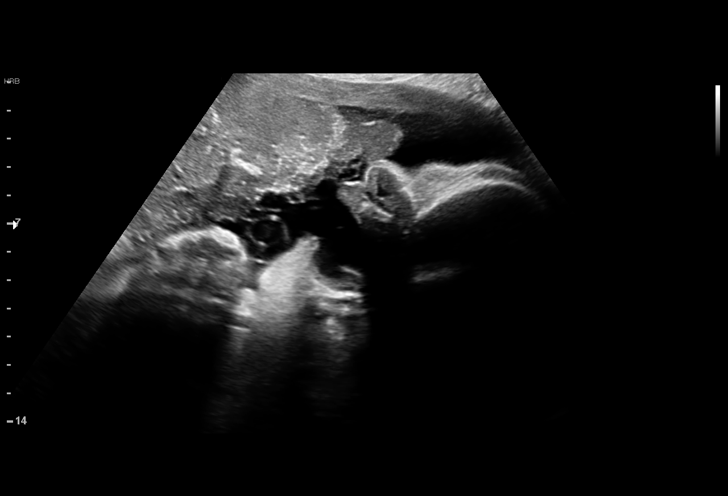
[im 21/31]
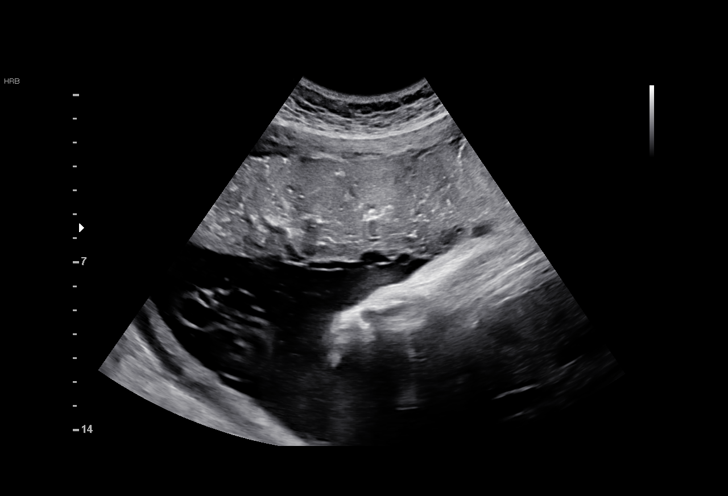
[im 23/31]
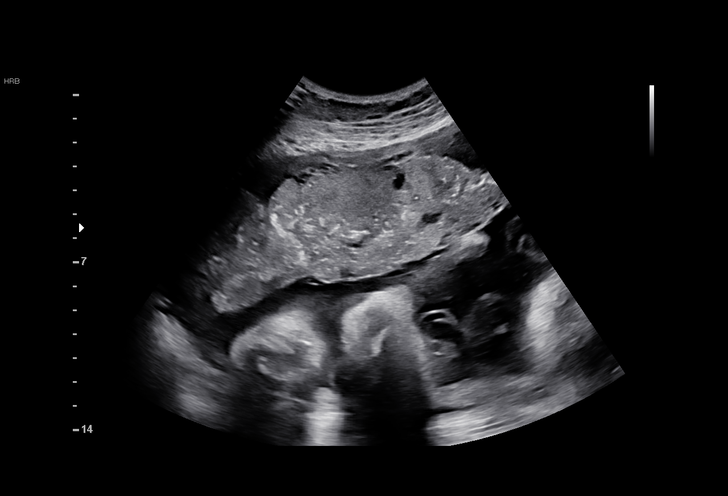
[im 25/31]
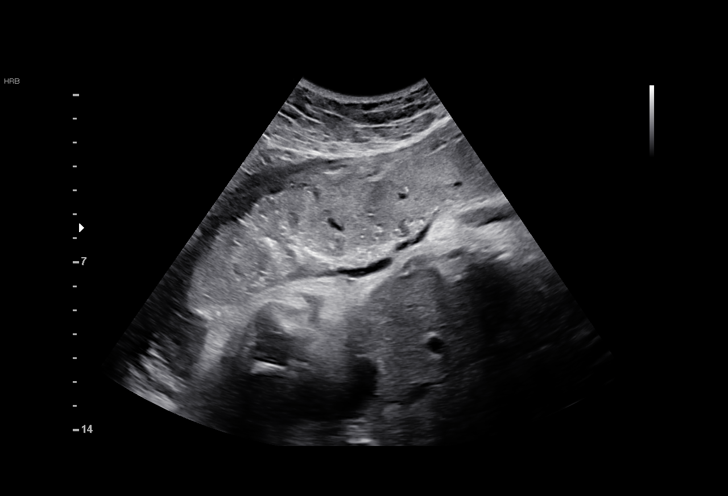
[im 27/31]
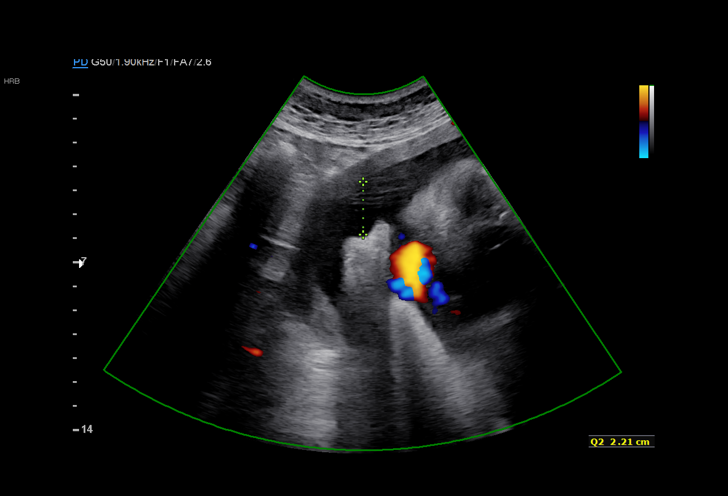
[im 29/31]
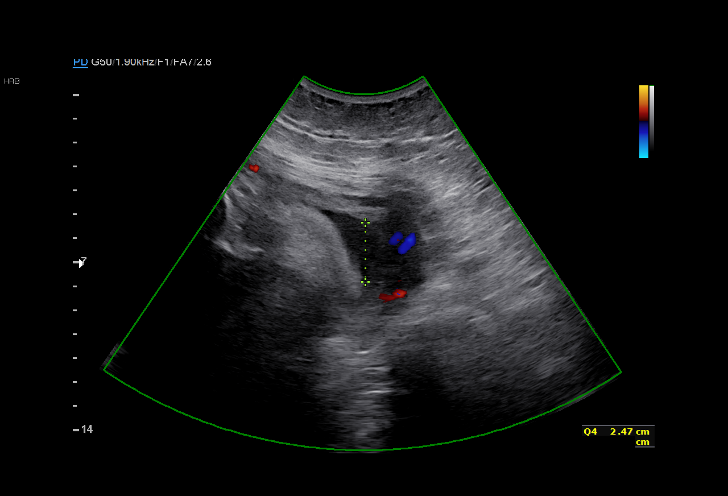

[13 of 28 positions shown; findings below may reference images not displayed]

#101

                                                       ANNAMMA
     STRESS                                            ANNAMMA
 ----------------------------------------------------------------------

 ----------------------------------------------------------------------
Indications

  Gestational diabetes in pregnancy,
  controlled by oral hypoglycemic drugs
  Abnormal first trimester screen (NT) (T21
  Obesity complicating pregnancy, third
  trimester(BMI 41)
  Poor obstetric history: Previous gestational
  diabetes
  Previous cesarean delivery, antepartum
  Hypertension - Gestational? (labetalol,
  magnesium, asa)
  Uterine fibroids
  34 weeks gestation of pregnancy
 ----------------------------------------------------------------------
Vital Signs

 BMI:
Fetal Evaluation

 Num Of Fetuses:          1
 Cardiac Activity:        Observed
 Presentation:            Transverse, head to maternal left
 Placenta:                Anterior
 P. Cord Insertion:       Previously Visualized
 Amniotic Fluid
 AFI FV:      Within normal limits

 AFI Sum(cm)     %Tile       Largest Pocket(cm)
 10.59           25

 RUQ(cm)       RLQ(cm)       LUQ(cm)        LLQ(cm)

Biophysical Evaluation

 Amniotic F.V:   Within normal limits       F. Tone:         Observed
 F. Movement:    Observed                   Score:           [DATE]
 F. Breathing:   Observed
Biometry

 BPD:      92.6  mm     G. Age:  37w 4d         98  %    CI:        80.49   %    70 - 86
                                                         FL/HC:       20.1  %    20.1 -
 HC:       326   mm     G. Age:  37w 0d         68  %    HC/AC:       0.92       0.93 -
 AC:      355.3  mm     G. Age:  39w 3d       > 97  %    FL/BPD:      70.8  %    71 - 87
 FL:       65.6  mm     G. Age:  33w 6d         18  %    FL/AC:       18.5  %    20 - 24

 Est. FW:    3268   gm     7 lb 4 oz   > 90  %
OB History

 Gravidity:    2         Term:   1        Prem:   0        SAB:   0
 TOP:          0       Ectopic:  0        Living: 1
Gestational Age

 LMP:           36w 5d        Date:  04/25/18                 EDD:   01/30/19
 U/S Today:     37w 0d                                        EDD:   01/28/19
 Best:          34w 6d     Det. By:  Previous Ultrasound      EDD:   02/12/19
                                     (07/02/18)
Anatomy

 Cranium:               Appears normal         Aortic Arch:            Previously seen
 Cavum:                 Appears normal         Ductal Arch:            Previously seen
 Ventricles:            Appears normal         Diaphragm:              Appears normal
 Choroid Plexus:        Previously seen        Stomach:                Appears normal, left
                                                                       sided
 Cerebellum:            Previously seen        Abdomen:                Appears normal
 Posterior Fossa:       Previously seen        Abdominal Wall:         Previously seen
 Nuchal Fold:           Previously seen        Cord Vessels:           Previously seen
 Face:                  Orbits and profile     Kidneys:                Appear normal
                        previously seen
 Lips:                  Previously seen        Bladder:                Appears normal
 Thoracic:              Appears normal         Spine:                  Previously seen
 Heart:                 Previously seen        Upper Extremities:      Previously seen
 RVOT:                  Previously seen        Lower Extremities:      Previously seen
 LVOT:                  Previously seen

 Other:  Fetus appears to be a male. Heels and  Nasal bone previously
         visualized. Technically difficult due to maternal habitus and fetal
         position.
Cervix Uterus Adnexa
 Cervix
 Not visualized (advanced GA >84wks)
Impression

 Normal interval growth.
 Biophysical profile [DATE]
Recommendations

 Follow up growth in 4 weeks.
 Initiate weekly testing next week.
